# Patient Record
Sex: Male | Born: 1950 | Hispanic: Refuse to answer | Smoking: Current every day smoker
Health system: Southern US, Community
[De-identification: ages and names within clinical notes are randomized; demographics above are authoritative.]

## PROBLEM LIST (undated history)

## (undated) DIAGNOSIS — M1990 Unspecified osteoarthritis, unspecified site: Secondary | ICD-10-CM

## (undated) DIAGNOSIS — I739 Peripheral vascular disease, unspecified: Secondary | ICD-10-CM

## (undated) DIAGNOSIS — R635 Abnormal weight gain: Secondary | ICD-10-CM

## (undated) DIAGNOSIS — R609 Edema, unspecified: Secondary | ICD-10-CM

## (undated) DIAGNOSIS — S93409A Sprain of unspecified ligament of unspecified ankle, initial encounter: Secondary | ICD-10-CM

## (undated) DIAGNOSIS — M79604 Pain in right leg: Secondary | ICD-10-CM

## (undated) DIAGNOSIS — M791 Myalgia, unspecified site: Secondary | ICD-10-CM

## (undated) DIAGNOSIS — J45909 Unspecified asthma, uncomplicated: Secondary | ICD-10-CM

## (undated) DIAGNOSIS — J449 Chronic obstructive pulmonary disease, unspecified: Secondary | ICD-10-CM

## (undated) DIAGNOSIS — I10 Essential (primary) hypertension: Secondary | ICD-10-CM

## (undated) DIAGNOSIS — E785 Hyperlipidemia, unspecified: Secondary | ICD-10-CM

## (undated) DIAGNOSIS — R200 Anesthesia of skin: Secondary | ICD-10-CM

## (undated) DIAGNOSIS — K8020 Calculus of gallbladder without cholecystitis without obstruction: Secondary | ICD-10-CM

## (undated) DIAGNOSIS — K219 Gastro-esophageal reflux disease without esophagitis: Secondary | ICD-10-CM

## (undated) HISTORY — DX: Chronic obstructive pulmonary disease, unspecified: J44.9

## (undated) HISTORY — DX: Pain in right leg: M79.604

## (undated) HISTORY — DX: Anesthesia of skin: R20.0

## (undated) HISTORY — DX: Unspecified asthma, uncomplicated: J45.909

## (undated) HISTORY — DX: Unspecified osteoarthritis, unspecified site: M19.90

## (undated) HISTORY — DX: Myalgia, unspecified site: M79.10

## (undated) HISTORY — DX: Sprain of unspecified ligament of unspecified ankle, initial encounter: S93.409A

## (undated) HISTORY — PX: POLYPECTOMY: SHX149

## (undated) HISTORY — DX: Hyperlipidemia, unspecified: E78.5

## (undated) HISTORY — DX: Edema, unspecified: R60.9

## (undated) HISTORY — DX: Abnormal weight gain: R63.5

## (undated) HISTORY — DX: Gastro-esophageal reflux disease without esophagitis: K21.9

## (undated) HISTORY — DX: Peripheral vascular disease, unspecified: I73.9

---

## 2008-09-15 ENCOUNTER — Emergency Department (HOSPITAL_COMMUNITY): Admission: EM | Admit: 2008-09-15 | Discharge: 2008-09-15 | Payer: Self-pay | Admitting: Emergency Medicine

## 2009-10-04 ENCOUNTER — Ambulatory Visit: Payer: Self-pay | Admitting: Internal Medicine

## 2009-11-15 ENCOUNTER — Ambulatory Visit: Payer: Self-pay | Admitting: Internal Medicine

## 2010-02-16 ENCOUNTER — Ambulatory Visit: Payer: Self-pay | Admitting: Internal Medicine

## 2010-02-16 LAB — CONVERTED CEMR LAB
BUN: 19 mg/dL (ref 6–23)
CO2: 26 meq/L (ref 19–32)
Creatinine, Ser: 0.88 mg/dL (ref 0.40–1.50)
Glucose, Bld: 85 mg/dL (ref 70–99)
Magnesium: 2.2 mg/dL (ref 1.5–2.5)
TSH: 0.767 microintl units/mL (ref 0.350–4.500)
Total Bilirubin: 0.7 mg/dL (ref 0.3–1.2)

## 2010-10-18 LAB — COMPREHENSIVE METABOLIC PANEL
ALT: 21 U/L (ref 0–53)
AST: 24 U/L (ref 0–37)
CO2: 22 mEq/L (ref 19–32)
Chloride: 108 mEq/L (ref 96–112)
GFR calc Af Amer: >60 mL/min (ref >60)
GFR calc non Af Amer: >60 mL/min (ref >60)
Sodium: 142 mEq/L (ref 135–145)
Total Bilirubin: 0.6 mg/dL (ref 0.3–1.2)

## 2010-10-18 LAB — RAPID URINE DRUG SCREEN, HOSP PERFORMED
Benzodiazepines: NOT DETECTED
Cocaine: NOT DETECTED
Opiates: NOT DETECTED

## 2010-10-18 LAB — CBC
RBC: 5.05 MIL/uL (ref 4.22–5.81)
WBC: 7.5 10*3/uL (ref 4.0–10.5)

## 2010-10-18 LAB — DIFFERENTIAL
Basophils Absolute: 0.1 10*3/uL (ref 0.0–0.1)
Eosinophils Absolute: 0.2 10*3/uL (ref 0.0–0.7)
Eosinophils Relative: 3 % (ref 0–5)
Lymphs Abs: 2.9 10*3/uL (ref 0.7–4.0)
Monocytes Absolute: 0.5 10*3/uL (ref 0.1–1.0)

## 2010-10-18 LAB — URINALYSIS, ROUTINE W REFLEX MICROSCOPIC
Bilirubin Urine: NEGATIVE
Hgb urine dipstick: NEGATIVE
Ketones, ur: NEGATIVE mg/dL
Nitrite: NEGATIVE
pH: 6.5 (ref 5.0–8.0)

## 2010-10-18 LAB — URINE MICROSCOPIC-ADD ON

## 2010-10-18 LAB — LIPASE, BLOOD: Lipase: 27 U/L (ref 11–59)

## 2010-10-18 LAB — ETHANOL: Alcohol, Ethyl (B): 171 mg/dL — ABNORMAL HIGH (ref 0–10)

## 2013-04-20 ENCOUNTER — Emergency Department (HOSPITAL_COMMUNITY)
Admission: EM | Admit: 2013-04-20 | Discharge: 2013-04-20 | Disposition: A | Discharge status: Home / Self Care | Payer: Self-pay | Attending: Emergency Medicine | Admitting: Emergency Medicine

## 2013-04-20 ENCOUNTER — Encounter (HOSPITAL_COMMUNITY): Payer: Self-pay | Admitting: Emergency Medicine

## 2013-04-20 ENCOUNTER — Emergency Department (HOSPITAL_COMMUNITY): Payer: Self-pay

## 2013-04-20 DIAGNOSIS — Z79899 Other long term (current) drug therapy: Secondary | ICD-10-CM | POA: Insufficient documentation

## 2013-04-20 DIAGNOSIS — R110 Nausea: Secondary | ICD-10-CM | POA: Insufficient documentation

## 2013-04-20 DIAGNOSIS — F17200 Nicotine dependence, unspecified, uncomplicated: Secondary | ICD-10-CM | POA: Insufficient documentation

## 2013-04-20 DIAGNOSIS — Z7982 Long term (current) use of aspirin: Secondary | ICD-10-CM | POA: Insufficient documentation

## 2013-04-20 DIAGNOSIS — I10 Essential (primary) hypertension: Secondary | ICD-10-CM | POA: Insufficient documentation

## 2013-04-20 DIAGNOSIS — B349 Viral infection, unspecified: Secondary | ICD-10-CM

## 2013-04-20 DIAGNOSIS — J3489 Other specified disorders of nose and nasal sinuses: Secondary | ICD-10-CM | POA: Insufficient documentation

## 2013-04-20 DIAGNOSIS — B9789 Other viral agents as the cause of diseases classified elsewhere: Secondary | ICD-10-CM | POA: Insufficient documentation

## 2013-04-20 HISTORY — DX: Essential (primary) hypertension: I10

## 2013-04-20 LAB — INFLUENZA PANEL BY PCR (TYPE A & B): Influenza A By PCR: NEGATIVE

## 2013-04-20 NOTE — ED Provider Notes (Addendum)
CSN: [id number]     Arrival date & time 04/20/13  1153 History   First MD Initiated Contact with Patient 04/20/13 1532     Chief Complaint  Patient presents with  . Fever  . Nausea  . Nasal Congestion   (Consider location/radiation/quality/duration/timing/severity/associated sxs/prior Treatment) HPI Comments: Donald Martinez is a 62 y.o. Male who presents for evaluation of fever, malaise, nausea, and achiness. He stopped taking his blood pressure medication 3 days ago because he was ill. He is using over-the-counter medications, without relief. No recent travel outside of the country or known sick contacts. 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 There are no other known modifying factors.   Patient is a 62 y.o. male presenting with fever. The history is provided by the patient.  Fever   Past Medical History  Diagnosis Date  . Hypertension    History reviewed. No pertinent past surgical history. History reviewed. No pertinent family history. History  Substance Use Topics  . Smoking status: Current Every Day Smoker    Types: Cigarettes  . Smokeless tobacco: Not on file  . Alcohol Use: Yes    Review of Systems  Constitutional: Positive for fever.  All other systems reviewed and are negative.    Allergies  Review of patient's allergies indicates no known allergies.  Home Medications   Current Outpatient Rx  Name  Route  Sig  Dispense  Refill  . aspirin 81 MG tablet   Oral   Take 81 mg by mouth daily as needed for pain.         Marland Kitchen guaifenesin (ROBITUSSIN) 100 MG/5ML syrup   Oral   Take 200 mg by mouth 3 (three) times daily as needed for cough (cough).         Marland Kitchen PRESCRIPTION MEDICATION      Pt states he takes a "blood pressure medication" not sure where he fills it at. He has taken 1 pill in the last 4 months          BP 161/91  Pulse 65  Temp(Src)  98.2 F (36.8 C) (Oral)  Resp 18  Ht 5\' 6"  (1.676 m)  Wt 180 lb (81.647 kg)  BMI 29.07 kg/m2  SpO2 98% Physical Exam  Nursing note and vitals reviewed. Constitutional: He is oriented to person, place, and time. He appears well-developed and well-nourished.  HENT:  Head: Normocephalic and atraumatic.  Right Ear: External ear normal.  Left Ear: External ear normal.  Eyes: Conjunctivae and EOM are normal. Pupils are equal, round, and reactive to light.  Neck: Normal range of motion and phonation normal. Neck supple.  Cardiovascular: Normal rate, regular rhythm, normal heart sounds and intact distal pulses.   Initial blood pressure, elevated  Pulmonary/Chest: Effort normal and breath sounds normal. He exhibits no bony tenderness.  Abdominal: Soft. Normal appearance. There is no tenderness.  Musculoskeletal: Normal range of motion.  Neurological: He is alert and oriented to person, place, and time. No cranial nerve deficit or sensory deficit. He exhibits normal muscle tone. Coordination normal.  Skin: Skin is warm, dry and intact.  Psychiatric: He has a normal mood and affect. His behavior is normal. Judgment and thought content normal.    ED Course  Procedures (including critical care time)  Patient Vitals for the past 24 hrs:  BP Temp Temp src Pulse Resp SpO2 Height Weight  04/20/13 1900  161/91 mmHg - - 65 - 98 % - -  04/20/13 1844 159/98 mmHg - - 80 18 99 % - -  04/20/13 1707 196/111 mmHg 98.2 F (36.8 C) Oral 67 18 97 % - -  04/20/13 1203 196/100 mmHg 98.2 F (36.8 C) Oral 96 18 96 % 5\' 6"  (1.676 m) 180 lb (81.647 kg)   Labs Review Labs Reviewed  INFLUENZA PANEL BY PCR   Imaging Review Dg Chest 2 View  04/20/2013   CLINICAL DATA:  Fever, nausea and nasal congestion for 3 days.  EXAM: CHEST  2 VIEW  COMPARISON:  PA and lateral chest 09/15/2008.  FINDINGS: Heart size and mediastinal contours are within normal limits. Both lungs are clear. Visualized skeletal structures are  unremarkable.  IMPRESSION: No active cardiopulmonary disease.   Electronically Signed   By: Drusilla Kanner M.D.   On: 04/20/2013 16:21    8:20 PM Reevaluation with update and discussion. After initial assessment and treatment, an updated evaluation reveals He is comfortable.. Ellysia Char L     EKG Interpretation   None       MDM   1. Viral illness   2. Hypertension    Nonspecific symptoms with negative evaluation for serious illness. He can be treated symptomatically at home. He is stable for discharge  Nursing Notes Reviewed/ Care Coordinated, and agree without changes. Applicable Imaging Reviewed.  Interpretation of Laboratory Data incorporated into ED treatment   Plan: Home Medications- OTC medication for cold, restart blood pressure medicine as soon as possible.; Home Treatments and Observation- rest, fluids; return here if the recommended treatment, does not improve the symptoms; Recommended follow up- PCP, when necessary      Flint Melter, MD 04/20/13 2027  Flint Melter, MD 04/20/13 2035

## 2013-04-20 NOTE — ED Notes (Addendum)
Pt reports productive cough with yellow and green colored sputum, nasal congestion and draining also with yellow and green colored mucus, fever, and all over body aches x4 days. Pt reports he has been taking Robitussin, Aleve, and ASA with no relief. Pt states he takes medication for his HTN but has not taken it since Saturday due to taking all of the OTC medication. Pt states "I thought it would interfere with my BP medication."

## 2013-04-20 NOTE — ED Notes (Signed)
States he has not taken his BP medication since Saturday.

## 2013-04-20 NOTE — ED Notes (Signed)
Pt reports he does a lot of volunteer work in [employer] and is concerned he may have been exposed to some kind of virus

## 2013-04-20 NOTE — ED Notes (Signed)
Pt reports that since Sunday he has felt feverish and sluggish. States that he has also been nauseated but no vomiting. Reports generalized achy feeling. Denies any other symptoms at this time.

## 2014-10-07 ENCOUNTER — Encounter (HOSPITAL_COMMUNITY): Payer: Self-pay

## 2014-10-07 ENCOUNTER — Emergency Department (HOSPITAL_COMMUNITY)
Admission: EM | Admit: 2014-10-07 | Discharge: 2014-10-07 | Disposition: A | Discharge status: Home / Self Care | Payer: Self-pay | Attending: Emergency Medicine | Admitting: Emergency Medicine

## 2014-10-07 DIAGNOSIS — I10 Essential (primary) hypertension: Secondary | ICD-10-CM | POA: Insufficient documentation

## 2014-10-07 DIAGNOSIS — M79604 Pain in right leg: Secondary | ICD-10-CM | POA: Insufficient documentation

## 2014-10-07 DIAGNOSIS — Z720 Tobacco use: Secondary | ICD-10-CM | POA: Insufficient documentation

## 2014-10-07 DIAGNOSIS — M79605 Pain in left leg: Secondary | ICD-10-CM | POA: Insufficient documentation

## 2014-10-07 DIAGNOSIS — M79606 Pain in leg, unspecified: Secondary | ICD-10-CM

## 2014-10-07 MED ORDER — OXYCODONE-ACETAMINOPHEN 5-325 MG PO TABS
1.0000 | ORAL_TABLET | Freq: Once | ORAL | Status: AC
  Administered 2014-10-07: 1 via ORAL
  Filled 2014-10-07: qty 1

## 2014-10-07 MED ORDER — OXYCODONE-ACETAMINOPHEN 5-325 MG PO TABS: 1.0000 | ORAL_TABLET | Freq: Four times a day (QID) | ORAL | Status: DC | PRN

## 2014-10-07 NOTE — ED Notes (Signed)
Pt presents via EMS with c/o bilateral leg pain for 10 months. Pt has never seen a doctor for this complaint. Pt was laying in bed when EMS arrived, said he could hardly walk.

## 2014-10-07 NOTE — ED Notes (Signed)
Pt complains of bilateral leg pain for several months, he says it starts in his feet and travels to his knees, he says that it intermittent but seems to be worse

## 2014-10-07 NOTE — ED Provider Notes (Signed)
CSN: [id number]     Arrival date & time 10/07/14  0112 History   First MD Initiated Contact with Patient 10/07/14 [phone]     Chief Complaint  Patient presents with  . Leg Pain     (Consider location/radiation/quality/duration/timing/severity/associated sxs/prior Treatment) Patient is a 64 y.o. male presenting with leg pain. The history is provided by the patient.  Leg Pain Location:  Leg Time since incident:  6 months Injury: no   Leg location:  L leg and R leg Pain details:    Quality:  Aching   Radiates to:  Does not radiate   Severity:  Moderate   Onset quality:  Gradual   Duration:  6 months   Timing:  Constant   Progression:  Worsening Chronicity:  Chronic Dislocation: no   Foreign body present:  No foreign bodies Prior injury to area:  No Relieved by:  Nothing Worsened by:  Activity Ineffective treatments:  None tried Associated symptoms: no fever and no neck pain     Past Medical History  Diagnosis Date  . Hypertension    History reviewed. No pertinent past surgical history. History reviewed. No pertinent family history. History  Substance Use Topics  . Smoking status: Current Every Day Smoker    Types: Cigarettes  . Smokeless tobacco: Not on file  . Alcohol Use: Yes    Review of Systems  Constitutional: Negative for fever.  HENT: Negative for drooling and rhinorrhea.   Eyes: Negative for pain.  Respiratory: Negative for cough and shortness of breath.   Cardiovascular: Negative for chest pain and leg swelling.  Gastrointestinal: Negative for nausea, vomiting, abdominal pain and diarrhea.  Genitourinary: Negative for dysuria and hematuria.  Musculoskeletal: Negative for gait problem and neck pain.  Skin: Negative for color change.  Neurological: Negative for numbness and headaches.  Hematological: Negative for adenopathy.  Psychiatric/Behavioral: Negative for behavioral problems.  All other systems reviewed and are negative.     Allergies  Review  of patient's allergies indicates no known allergies.  Home Medications   Prior to Admission medications   Medication Sig Start Date End Date Taking? Authorizing Provider  acetaminophen (TYLENOL) 500 MG tablet Take 1,000 mg by mouth every 6 (six) hours as needed for mild pain.   Yes Historical Provider, MD  ibuprofen (ADVIL,MOTRIN) 200 MG tablet Take 400 mg by mouth every 6 (six) hours as needed for moderate pain.   Yes Historical Provider, MD   BP 101/52 mmHg  Pulse 82  Temp(Src) 98.1 F (36.7 C) (Oral)  Resp 8  Ht 5\' 6"  (1.676 m)  Wt 185 lb (83.915 kg)  BMI 29.87 kg/m2  SpO2 94% Physical Exam  Constitutional: He is oriented to person, place, and time. He appears well-developed and well-nourished.  HENT:  Head: Normocephalic and atraumatic.  Right Ear: External ear normal.  Left Ear: External ear normal.  Nose: Nose normal.  Mouth/Throat: Oropharynx is clear and moist. No oropharyngeal exudate.  Eyes: Conjunctivae and EOM are normal. Pupils are equal, round, and reactive to light.  Neck: Normal range of motion. Neck supple.  Cardiovascular: Normal rate, regular rhythm, normal heart sounds and intact distal pulses.  Exam reveals no gallop and no friction rub.   No murmur heard. Pulmonary/Chest: Effort normal and breath sounds normal. No respiratory distress. He has no wheezes.  Abdominal: Soft. Bowel sounds are normal. He exhibits no distension. There is no tenderness. There is no rebound and no guarding.  Musculoskeletal: Normal range of motion. He exhibits no  edema or tenderness.  2+ distal pulses in bilateral lower extremities. Normal capillary refill in the lower extremity. Lower extremities appear symmetrical without focal tenderness and are well-perfused and warm.  Normal range of motion of the hips, knees, and ankles bilaterally.  Normal strength and sensation in the lower extremities.  Neurological: He is alert and oriented to person, place, and time.  Skin: Skin is warm  and dry.  Psychiatric: He has a normal mood and affect. His behavior is normal.  Nursing note and vitals reviewed.   ED Course  Procedures (including critical care time) Labs Review Labs Reviewed - No data to display  Imaging Review No results found.   EKG Interpretation None      MDM   Final diagnoses:  Pain of lower extremity, unspecified laterality    4:30 AM 64 y.o. male who presents with diffuse leg pain for 6 months. He states that it has worsened in the last 2 weeks. He denies any fevers or other associated symptoms. Does seem to be slightly worse with ambulation. While he complains of pain in all his joints there is no focal joint that is tender on exam. He has normal range of motion of his lower extremities without significant pain. He has 2+ distal pulses and normal sensation. No evidence of poor perfusion on my exam. He has normal capillary refill and warm lower extremities. Will provide a short prescription of pain medicine. Will recommend follow-up with a PCP.    Pamella Pert, MD 10/08/14 1054

## 2014-11-03 ENCOUNTER — Ambulatory Visit: Payer: Self-pay | Admitting: Internal Medicine

## 2015-05-17 DIAGNOSIS — I10 Essential (primary) hypertension: Secondary | ICD-10-CM

## 2015-05-17 DIAGNOSIS — Z23 Encounter for immunization: Secondary | ICD-10-CM

## 2015-05-21 NOTE — Congregational Nurse Program (Signed)
Client was  followed by  Nurse while  Living at [facility]  was discharged  from [facility] 03-29-15 but  requested the flu vaccine. Nurse called  client on Wednesday to set up appointment for 05-22-15  for his vaccine. Client states he has been okay  but  needs his  blood pressure checked also.

## 2015-05-24 DIAGNOSIS — I10 Essential (primary) hypertension: Secondary | ICD-10-CM

## 2015-05-24 DIAGNOSIS — Z23 Encounter for immunization: Secondary | ICD-10-CM

## 2015-05-24 NOTE — Congregational Nurse Program (Signed)
Client in for his flu shot  As nurse called  And offered it to him. Upon his discharge nurse had informed client he could come back and get his flu shot. Client is doing well in  his apartment. Cooking fresh vegetables and eating well. 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 Client looking good , counseled regarding his elevated blood pressure ,to go home relax ,take meds and will monitor blood pressure. Return to PCP as needed. Client has history of his blood  Pressure up and down. Was seen by  PCP several months ago but  No change in B/p. Client aware of signs and symptoms of distress .

## 2017-09-11 ENCOUNTER — Encounter: Payer: Self-pay | Admitting: Primary Care

## 2018-01-08 ENCOUNTER — Emergency Department (HOSPITAL_COMMUNITY)
Admission: EM | Admit: 2018-01-08 | Discharge: 2018-01-09 | Disposition: A | Discharge status: Home / Self Care | Payer: Medicare Other | Attending: Emergency Medicine | Admitting: Emergency Medicine

## 2018-01-08 ENCOUNTER — Other Ambulatory Visit: Payer: Self-pay

## 2018-01-08 ENCOUNTER — Emergency Department (HOSPITAL_COMMUNITY): Payer: Medicare Other

## 2018-01-08 DIAGNOSIS — F17210 Nicotine dependence, cigarettes, uncomplicated: Secondary | ICD-10-CM | POA: Insufficient documentation

## 2018-01-08 DIAGNOSIS — N179 Acute kidney failure, unspecified: Secondary | ICD-10-CM | POA: Diagnosis not present

## 2018-01-08 DIAGNOSIS — R079 Chest pain, unspecified: Secondary | ICD-10-CM | POA: Diagnosis present

## 2018-01-08 DIAGNOSIS — E860 Dehydration: Secondary | ICD-10-CM

## 2018-01-08 DIAGNOSIS — Z79899 Other long term (current) drug therapy: Secondary | ICD-10-CM | POA: Insufficient documentation

## 2018-01-08 DIAGNOSIS — I10 Essential (primary) hypertension: Secondary | ICD-10-CM | POA: Insufficient documentation

## 2018-01-08 DIAGNOSIS — R0789 Other chest pain: Secondary | ICD-10-CM | POA: Insufficient documentation

## 2018-01-08 DIAGNOSIS — F1010 Alcohol abuse, uncomplicated: Secondary | ICD-10-CM | POA: Diagnosis not present

## 2018-01-08 LAB — COMPREHENSIVE METABOLIC PANEL
ALBUMIN: 3.8 g/dL (ref 3.5–5.0)
ALT: 24 U/L (ref 0–44)
AST: 32 U/L (ref 15–41)
Alkaline Phosphatase: 69 U/L (ref 38–126)
Anion gap: 13 (ref 5–15)
BILIRUBIN TOTAL: 0.9 mg/dL (ref 0.3–1.2)
BUN: 18 mg/dL (ref 8–23)
CHLORIDE: 108 mmol/L (ref 98–111)
CO2: 19 mmol/L — ABNORMAL LOW (ref 22–32)
CREATININE: 1.88 mg/dL — AB (ref 0.61–1.24)
Calcium: 8.4 mg/dL — ABNORMAL LOW (ref 8.9–10.3)
GFR calc Af Amer: 42 mL/min — ABNORMAL LOW (ref >60)
GFR, EST NON AFRICAN AMERICAN: 36 mL/min — AB (ref >60)
GLUCOSE: 130 mg/dL — AB (ref 70–99)
POTASSIUM: 4.2 mmol/L (ref 3.5–5.1)
Sodium: 140 mmol/L (ref 135–145)
TOTAL PROTEIN: 6.9 g/dL (ref 6.5–8.1)

## 2018-01-08 LAB — CBC WITH DIFFERENTIAL/PLATELET
ABS IMMATURE GRANULOCYTES: 0 10*3/uL (ref 0.0–0.1)
BASOS ABS: 0.1 10*3/uL (ref 0.0–0.1)
Basophils Relative: 1 %
EOS PCT: 2 %
Eosinophils Absolute: 0.1 10*3/uL (ref 0.0–0.7)
HCT: 44.5 % (ref 39.0–52.0)
HEMOGLOBIN: 15.3 g/dL (ref 13.0–17.0)
Immature Granulocytes: 1 %
LYMPHS PCT: 28 %
Lymphs Abs: 1.5 10*3/uL (ref 0.7–4.0)
MCH: 30.7 pg (ref 26.0–34.0)
MCHC: 34.4 g/dL (ref 30.0–36.0)
MCV: 89.4 fL (ref 78.0–100.0)
MONO ABS: 0.3 10*3/uL (ref 0.1–1.0)
MONOS PCT: 6 %
NEUTROS ABS: 3.3 10*3/uL (ref 1.7–7.7)
Neutrophils Relative %: 62 %
Platelets: 223 10*3/uL (ref 150–400)
RBC: 4.98 MIL/uL (ref 4.22–5.81)
RDW: 11.9 % (ref 11.5–15.5)
WBC: 5.3 10*3/uL (ref 4.0–10.5)

## 2018-01-08 LAB — TROPONIN I

## 2018-01-08 LAB — ETHANOL: ALCOHOL ETHYL (B): 291 mg/dL — AB (ref <10)

## 2018-01-08 LAB — CBG MONITORING, ED: Glucose-Capillary: 131 mg/dL — ABNORMAL HIGH (ref 70–99)

## 2018-01-08 LAB — LIPASE, BLOOD: Lipase: 42 U/L (ref 11–51)

## 2018-01-08 MED ORDER — SODIUM CHLORIDE 0.9 % IV BOLUS
1000.0000 mL | Freq: Once | INTRAVENOUS | Status: AC
  Administered 2018-01-08: 1000 mL via INTRAVENOUS

## 2018-01-08 MED ORDER — LACTATED RINGERS IV BOLUS
1000.0000 mL | Freq: Once | INTRAVENOUS | Status: AC
  Administered 2018-01-08: 1000 mL via INTRAVENOUS

## 2018-01-08 NOTE — ED Provider Notes (Signed)
[facility] EMERGENCY DEPARTMENT Provider Note   CSN: [id number] Arrival date & time: 01/08/18  1936  History   Chief Complaint Chief Complaint  Patient presents with  . Hypotension  . Chest Pain    HPI Donald Martinez is a 67 y.o. male.  HPI   Patient is a 67 year old male with history of hypertension presenting to the ED for multiple complaints and intoxication. Per patient, he was outside in the heat walking to a holiday festival after drinking "a shot of tequila and beer." He also endorses drinking heavily last night. He states he had worsening dyspnea, chest pain, and abdominal cramping today, although it is difficult to elicit the chronicity of the symptoms as he states they have been going on daily for months. He does state that he had a brief syncopal episode today as well when he was sitting down and fell asleep. He currently feels better. Per RN, he flagged down a patrolling the EMS crew found him to be hypotensive to 80/50 initially which normalized after 500 mL IV NS bolus. No vomiting or diarrhea. Chronic cough. No recent injuries.  Past Medical History:  Diagnosis Date  . Hypertension     There are no active problems to display for this patient.   No past surgical history on file.      Home Medications    Prior to Admission medications   Medication Sig Start Date End Date Taking? Authorizing Provider  acetaminophen (TYLENOL) 500 MG tablet Take 1,000 mg by mouth every 6 (six) hours as needed for mild pain.   Yes [provider]  ATENOLOL PO Take 1 tablet by mouth daily.   Yes [provider]  fluticasone (FLONASE) 50 MCG/ACT nasal spray Place 1 spray into both nostrils daily as needed for allergies or rhinitis.   Yes [provider]  ibuprofen (ADVIL,MOTRIN) 200 MG tablet Take 600 mg by mouth every morning.    Yes [provider]  lisinopril (PRINIVIL,ZESTRIL) 5 MG tablet Take 5 mg by mouth daily.   Yes  [provider]  oxyCODONE-acetaminophen (PERCOCET) 5-325 MG per tablet Take 1 tablet by mouth every 6 (six) hours as needed for moderate pain. 10/07/14  Yes Pamella Pert, MD  vitamin B-12 (CYANOCOBALAMIN) 1000 MCG tablet Take 1,000 mcg by mouth daily.   Yes [provider]    Family History No family history on file.  Social History Social History   Tobacco Use  . Smoking status: Current Every Day Smoker    Types: Cigarettes  Substance Use Topics  . Alcohol use: Yes  . Drug use: No     Allergies   Patient has no known allergies.   Review of Systems Review of Systems  Constitutional: Negative for fever.  HENT: Negative for congestion.   Eyes: Negative for visual disturbance.  Respiratory: Positive for cough and shortness of breath.   Cardiovascular: Positive for chest pain.  Gastrointestinal: Positive for abdominal pain. Negative for diarrhea and vomiting.  Genitourinary: Negative for dysuria.  Musculoskeletal: Negative for back pain.  Skin: Negative for rash.  Neurological: Negative for headaches.  All other systems reviewed and are negative.    Physical Exam Updated Vital Signs BP (!) 110/48   Pulse 74   Temp 97.9 F (36.6 C) (Oral)   Resp 17   Ht 5\' 6"  (1.676 m)   Wt 77.1 kg (170 lb)   SpO2 95%   BMI 27.44 kg/m   Physical Exam  Constitutional: He is oriented  to person, place, and time. No distress.  Speech is slurred and patient making jokes  HENT:  Head: Normocephalic and atraumatic.  Mouth/Throat: Oropharynx is clear and moist.  Eyes: Pupils are equal, round, and reactive to light.  Neck: Neck supple. No JVD present.  Cardiovascular: Normal rate, regular rhythm, normal heart sounds and intact distal pulses.  No murmur heard. Pulmonary/Chest: Breath sounds normal. No respiratory distress. He has no wheezes. He has no rales.  Forced upper airway wheezing, no wheezing on chest auscultation  Abdominal: Soft. He exhibits no  distension and no mass. There is no tenderness. There is no guarding.  Musculoskeletal: Normal range of motion. He exhibits no edema.  No calf tenderness.  Neurological: He is alert and oriented to person, place, and time.  Skin: Skin is warm and dry.  Nursing note and vitals reviewed.    ED Treatments / Results  Labs (all labs ordered are listed, but only abnormal results are displayed) Labs Reviewed  COMPREHENSIVE METABOLIC PANEL - Abnormal; Notable for the following components:      Result Value   CO2 19 (*)    Glucose, Bld 130 (*)    Creatinine, Ser 1.88 (*)    Calcium 8.4 (*)    GFR calc non Af Amer 36 (*)    GFR calc Af Amer 42 (*)    All other components within normal limits  ETHANOL - Abnormal; Notable for the following components:   Alcohol, Ethyl (B) 291 (*)    All other components within normal limits  CBG MONITORING, ED - Abnormal; Notable for the following components:   Glucose-Capillary 131 (*)    All other components within normal limits  CBC WITH DIFFERENTIAL/PLATELET  TROPONIN I  LIPASE, BLOOD    EKG EKG Interpretation  Date/Time:  Thursday January 08 2018 19:39:44 EDT Ventricular Rate:  87 PR Interval:    QRS Duration: 90 QT Interval:  373 QTC Calculation: 449 R Axis:   43 Text Interpretation:  Sinus rhythm Borderline T wave abnormalities No significant change since last tracing Confirmed by Theotis Burrow [phone]) on 01/08/2018 7:46:53 PM   Radiology Dg Chest 2 View  Result Date: 01/08/2018 CLINICAL DATA:  Central chest pain and shortness of breath 4 days. EXAM: CHEST - 2 VIEW COMPARISON:  04/20/2013 FINDINGS: Lungs are adequately inflated and otherwise clear. Cardiomediastinal silhouette is within normal. Mild degenerate change of the spine. Minimal anterior wedging of 2 adjacent lower thoracic vertebral bodies likely chronic. IMPRESSION: No active cardiopulmonary disease. Electronically Signed   By: Marin Olp M.D.   On: 01/08/2018 20:42     Procedures Procedures (including critical care time)  Medications Ordered in ED Medications  sodium chloride 0.9 % bolus 1,000 mL (0 mLs Intravenous Stopped 01/08/18 2115)  lactated ringers bolus 1,000 mL (0 mLs Intravenous Stopped 01/08/18 2336)     Initial Impression / Assessment and Plan / ED Course  I have reviewed the triage vital signs and the nursing notes.  Pertinent labs & imaging results that were available during my care of the patient were reviewed by me and considered in my medical decision making (see chart for details).  Patient with multiple somatic complaints in the setting of alcohol intoxication.  On further elucidation, chest symptoms are chronic and musculoskeletal in description. Worse with certain positions. EKG shows no signs of acute ischemia and is unchanged from prior. CXR is clear. No active symptoms. Initial troponin is negative and do not feel serial biomarkers is indicated with the  chronicity of his symptoms. Also considered PE. He does report vague history of blood clot in the leg when asked directly. However, strongly doubt PE without characteristic symptoms, tachycardia, hypoxia, or signs of DVT at this time. Do not feel risks of CTA are warranted given his AKI as below, and do not feel admission for VQ scan is warranted.  His abdomen is benign. Screening labs are otherwise significant for elevated ethanol at 291 and AKI with creatinine 1.88, suspect dehydration. Patient hydrated with 2 L IV crystalloid in addition to 500 mL given PTA. As patient is tolerating by mouth intake, do not feel admission warranted at this time. Long discussion held with patient regarding his AKI and need for hydration at home to prevent further permanent renal dysfunction, potentially leading to dialysis. Patient verbalized understanding. He did appear mildly intoxicated at the end of the encounter, however he has his sobered friend Jeneen Rinks) at bedside who states he will take him home,  and that he has a roommate at home.   While in the ED, I was called to bedside by his friend for patient going to sleep in the middle their conversation. Patient was sleeping when I walked in the room and quickly woke to sternal rub. Suspect fatigue and intoxication. No arrhythmia on telemetry. He was walking briskly in the hallway with his cane per baseline. He remained pleasant, cooperative, and eager for discharge to eat. Will discharge home with PCP follow up.  Patient and friend informed of all ED findings. Return precautions and follow up plan reviewed. All questions answered.   Final Clinical Impressions(s) / ED Diagnoses   Final diagnoses:  AKI (acute kidney injury) ([place])  Dehydration  Atypical chest pain  Alcohol abuse     Prescilla Sours, MD 01/09/18 7342    Rex Kras Wenda Overland, MD 01/11/18 1724

## 2018-01-08 NOTE — ED Notes (Signed)
Pt ambulated independently with cane stated he felt "comfortable"

## 2018-01-08 NOTE — ED Notes (Signed)
Patient transported to X-ray 

## 2018-01-08 NOTE — ED Triage Notes (Signed)
[place] [facility] [place] flagged down patrolling EMS. Reports centralized chest pain non-radiating and SOB; says he passed out, no syncope reported by [facility] Hypotensive, initial 80/50.Donald Martinez..received 500cc bolus up to 111/63; NSR HR 82, 95% on RA; 20 gage LHand  Reports 1 beer but appears intoxicated

## 2018-06-22 ENCOUNTER — Encounter: Payer: Self-pay | Admitting: Internal Medicine

## 2018-07-21 ENCOUNTER — Other Ambulatory Visit (INDEPENDENT_AMBULATORY_CARE_PROVIDER_SITE_OTHER): Payer: Medicare HMO

## 2018-07-21 ENCOUNTER — Encounter: Payer: Self-pay | Admitting: Internal Medicine

## 2018-07-21 ENCOUNTER — Ambulatory Visit (INDEPENDENT_AMBULATORY_CARE_PROVIDER_SITE_OTHER): Payer: Medicare HMO | Admitting: Internal Medicine

## 2018-07-21 VITALS — BP 100/80 | HR 68 | Ht 66.0 in | Wt 191.6 lb

## 2018-07-21 DIAGNOSIS — Z1211 Encounter for screening for malignant neoplasm of colon: Secondary | ICD-10-CM

## 2018-07-21 DIAGNOSIS — R109 Unspecified abdominal pain: Secondary | ICD-10-CM

## 2018-07-21 DIAGNOSIS — K219 Gastro-esophageal reflux disease without esophagitis: Secondary | ICD-10-CM | POA: Diagnosis not present

## 2018-07-21 DIAGNOSIS — Z720 Tobacco use: Secondary | ICD-10-CM

## 2018-07-21 DIAGNOSIS — F1010 Alcohol abuse, uncomplicated: Secondary | ICD-10-CM

## 2018-07-21 DIAGNOSIS — R194 Change in bowel habit: Secondary | ICD-10-CM

## 2018-07-21 DIAGNOSIS — K5901 Slow transit constipation: Secondary | ICD-10-CM

## 2018-07-21 LAB — CBC WITH DIFFERENTIAL/PLATELET
BASOS PCT: 1.1 % (ref 0.0–3.0)
Basophils Absolute: 0.1 10*3/uL (ref 0.0–0.1)
EOS ABS: 0.2 10*3/uL (ref 0.0–0.7)
Eosinophils Relative: 4.2 % (ref 0.0–5.0)
HCT: 44.5 % (ref 39.0–52.0)
Hemoglobin: 15.7 g/dL (ref 13.0–17.0)
LYMPHS ABS: 1.1 10*3/uL (ref 0.7–4.0)
Lymphocytes Relative: 23.2 % (ref 12.0–46.0)
MCHC: 35.3 g/dL (ref 30.0–36.0)
MCV: 89.9 fl (ref 78.0–100.0)
MONO ABS: 0.5 10*3/uL (ref 0.1–1.0)
MONOS PCT: 9.3 % (ref 3.0–12.0)
NEUTROS ABS: 3 10*3/uL (ref 1.4–7.7)
Neutrophils Relative %: 62.2 % (ref 43.0–77.0)
PLATELETS: 233 10*3/uL (ref 150.0–400.0)
RBC: 4.95 Mil/uL (ref 4.22–5.81)
RDW: 13.2 % (ref 11.5–15.5)
WBC: 4.9 10*3/uL (ref 4.0–10.5)

## 2018-07-21 LAB — COMPREHENSIVE METABOLIC PANEL
ALT: 19 U/L (ref 0–53)
AST: 18 U/L (ref 0–37)
Albumin: 3.9 g/dL (ref 3.5–5.2)
Alkaline Phosphatase: 102 U/L (ref 39–117)
BUN: 16 mg/dL (ref 6–23)
CHLORIDE: 104 meq/L (ref 96–112)
CO2: 26 meq/L (ref 19–32)
CREATININE: 1.02 mg/dL (ref 0.40–1.50)
Calcium: 9.2 mg/dL (ref 8.4–10.5)
GFR: 77.77 mL/min (ref >60.00)
GLUCOSE: 107 mg/dL — AB (ref 70–99)
Potassium: 3.9 mEq/L (ref 3.5–5.1)
Sodium: 138 mEq/L (ref 135–145)
Total Bilirubin: 0.4 mg/dL (ref 0.2–1.2)
Total Protein: 6.9 g/dL (ref 6.0–8.3)

## 2018-07-21 LAB — LIPASE: LIPASE: 49 U/L (ref 11.0–59.0)

## 2018-07-21 MED ORDER — NA SULFATE-K SULFATE-MG SULF 17.5-3.13-1.6 GM/177ML PO SOLN: 1.0000 | Freq: Once | ORAL | 0 refills | Status: AC

## 2018-07-21 NOTE — Patient Instructions (Signed)
Your provider has requested that you go to the basement level for lab work before leaving today. Press "B" on the elevator. The lab is located at the first door on the left as you exit the elevator.  You have been scheduled for an abdominal ultrasound at [facility] (1st floor of hospital) on 07/24/2018 at 8:30am. Please arrive 15 minutes prior to your appointment for registration. Make certain not to have anything to eat or drink after midnight prior to your appointment. Should you need to reschedule your appointment, please contact radiology at [phone]. This test typically takes about 30 minutes to perform.  You have been scheduled for an endoscopy and colonoscopy. Please follow the written instructions given to you at your visit today. Please pick up your prep supplies at the pharmacy within the next 1-3 days. If you use inhalers (even only as needed), please bring them with you on the day of your procedure. Your physician has requested that you go to www.[email or web address].com and enter the access code given to you at your visit today. This web site gives a general overview about your procedure. However, you should still follow specific instructions given to you by our office regarding your preparation for the procedure.

## 2018-07-21 NOTE — Progress Notes (Signed)
HISTORY OF PRESENT ILLNESS:  Donald Martinez is a 68 y.o. male, [place] native, who was sent by Donald Folks, NP regarding multiple GI complaints and the need for endoscopic evaluation.  Patient reports a long hiatus of healthcare due to lack of medical insurance.  He recently established care with Donald Martinez.  The patient has multiple GI chief complaints including chronic GERD, bloating, nausea, and change in bowel habits with constipation.  He tells me that he has a 4-year history of problems with abdominal cramping which occurs several times per month.  Typically last 20 minutes.  He self medicates with Aleve and stretching.  He also has a history of classic GERD for which PPI therapy is helpful.  Currently taking omeprazole 20 mg daily.  He reports weight gain.  Also problems with significant abdominal bloating and worsening constipation for which he takes laxatives on demand.  Review of outside records shows that the patient went to the emergency room January 08, 2018 with alcohol intoxication and dehydration.  Review of x-rays shows his chest x-ray was clear.  Review of laboratories finds unremarkable comprehensive metabolic panel.  Negative troponin.  Normal CBC with hemoglobin 15.3.  The patient is a chronic smoker.  He also reports that he drinks 6 alcoholic beverages daily including beer and liquor.  REVIEW OF SYSTEMS:  All non-GI ROS negative unless otherwise stated in the HPI except for sinus and allergy trouble, arthritis, back pain, cough, shortness of breath, sleeping problems  Past Medical History:  Diagnosis Date  . COPD (chronic obstructive pulmonary disease) ([place])   . Hypertension     No past surgical history on file.  Social History Donald Martinez  reports that he has been smoking cigarettes. He has never used smokeless tobacco. He reports current alcohol use. He reports that he does not use drugs.  family history includes Breast cancer in his sister; Heart disease in his father and  mother; Pancreatic cancer in his brother; Prostate cancer in his brother.  No Known Allergies     PHYSICAL EXAMINATION: Vital signs: BP 100/80   Pulse 68   Ht 5\' 6"  (1.676 m)   Wt 191 lb 9.6 oz (86.9 kg)   BMI 30.93 kg/m   Constitutional: generally well-appearing, no acute distress Psychiatric: alert and oriented x3, cooperative Eyes: extraocular movements intact, anicteric, conjunctiva pink Mouth: oral pharynx moist, no lesions Neck: supple no lymphadenopathy Cardiovascular: heart regular rate and rhythm, no murmur Lungs: clear to auscultation bilaterally except for mild end expiratory wheezing bilaterally Abdomen: soft, obese, nontender, nondistended, no obvious ascites, no peritoneal signs, normal bowel sounds, no organomegaly Rectal: Deferred until colonoscopy Extremities: no clubbing or cyanosis.  Trace lower extremity edema bilaterally Skin: no lesions on visible extremities Neuro: No focal deficits. No asterixis.  Cranial nerves intact  ASSESSMENT:  1.  Chronic intermittent abdominal cramping discomfort.  Etiology unclear.  Progressive abdominal distention.  Rule out liver disease.  Rule out ascites 2.  Chronic GERD.  Requires PPI for relief.  Takes chronic NSAIDs.  Rule out ulcer disease 3.  Progressive constipation with bloating.  Rule out functional.  Rule out obstructive process such as cancer 4.  Chronic alcohol abuse 5.  Chronic tobacco abuse  PLAN:  1.  Laboratories today including CBC, comprehensive metabolic panel, serum lipase.  We will contact the patient directly with these results 2.  Abdominal ultrasound to evaluate abdominal distention.  Evaluate the liver.  Rule out ascites.  We will contact the patient directly with the results  3.  Schedule upper endoscopy to evaluate abdominal discomfort and chronic GERD.The nature of the procedure, as well as the risks, benefits, and alternatives were carefully and thoroughly reviewed with the patient. Ample time for  discussion and questions allowed. The patient understood, was satisfied, and agreed to proceed. 4.  Schedule colonoscopy to provide colon cancer screening, evaluate change in bowel habits with abdominal discomfort and abdominal distention.  The principal concern would be colon cancer.The nature of the procedure, as well as the risks, benefits, and alternatives were carefully and thoroughly reviewed with the patient. Ample time for discussion and questions allowed. The patient understood, was satisfied, and agreed to proceed. 5.  Consider MiraLAX regularly if appropriate after above work-up completed 6.  Stop smoking.  Advised 7.  Decrease alcohol consumption to exceed no more than 3 standard alcoholic beverages per day and a male.  Advised A copy of this consultation note has been sent to Donald Martinez

## 2018-07-23 ENCOUNTER — Telehealth: Payer: Self-pay | Admitting: Internal Medicine

## 2018-07-23 NOTE — Telephone Encounter (Signed)
Pt social worker karen called in and stated that the pt wants to know if he still needs to keep appt for Friday if he was not able to purchase the NA Sulfate K? He stated that it was to expensive

## 2018-07-23 NOTE — Telephone Encounter (Signed)
Pt is scheduled for [place] tomorrow and does not need the suprep for that appt. Let pt know we have a suprep sample he can pick up for the [facility] and he knows to pick it up ahead of time.

## 2018-07-24 ENCOUNTER — Ambulatory Visit (HOSPITAL_COMMUNITY)
Admission: RE | Admit: 2018-07-24 | Discharge: 2018-07-24 | Disposition: A | Discharge status: Home / Self Care | Payer: Medicare HMO | Source: Ambulatory Visit | Attending: Internal Medicine | Admitting: Internal Medicine

## 2018-07-24 DIAGNOSIS — Z1211 Encounter for screening for malignant neoplasm of colon: Secondary | ICD-10-CM | POA: Diagnosis present

## 2018-07-24 DIAGNOSIS — K219 Gastro-esophageal reflux disease without esophagitis: Secondary | ICD-10-CM | POA: Insufficient documentation

## 2018-07-24 DIAGNOSIS — R109 Unspecified abdominal pain: Secondary | ICD-10-CM

## 2018-08-04 ENCOUNTER — Ambulatory Visit (AMBULATORY_SURGERY_CENTER): Payer: Medicare HMO | Admitting: Internal Medicine

## 2018-08-04 ENCOUNTER — Encounter: Payer: Self-pay | Admitting: Internal Medicine

## 2018-08-04 VITALS — BP 115/75 | HR 64 | Temp 99.3°F | Resp 21 | Ht 66.0 in | Wt 191.0 lb

## 2018-08-04 DIAGNOSIS — Z1211 Encounter for screening for malignant neoplasm of colon: Secondary | ICD-10-CM

## 2018-08-04 DIAGNOSIS — D123 Benign neoplasm of transverse colon: Secondary | ICD-10-CM | POA: Diagnosis not present

## 2018-08-04 DIAGNOSIS — D128 Benign neoplasm of rectum: Secondary | ICD-10-CM

## 2018-08-04 DIAGNOSIS — K317 Polyp of stomach and duodenum: Secondary | ICD-10-CM | POA: Diagnosis not present

## 2018-08-04 DIAGNOSIS — K449 Diaphragmatic hernia without obstruction or gangrene: Secondary | ICD-10-CM | POA: Diagnosis not present

## 2018-08-04 DIAGNOSIS — D122 Benign neoplasm of ascending colon: Secondary | ICD-10-CM

## 2018-08-04 DIAGNOSIS — D124 Benign neoplasm of descending colon: Secondary | ICD-10-CM | POA: Diagnosis not present

## 2018-08-04 DIAGNOSIS — K253 Acute gastric ulcer without hemorrhage or perforation: Secondary | ICD-10-CM

## 2018-08-04 DIAGNOSIS — K5140 Inflammatory polyps of colon without complications: Secondary | ICD-10-CM | POA: Diagnosis not present

## 2018-08-04 DIAGNOSIS — K219 Gastro-esophageal reflux disease without esophagitis: Secondary | ICD-10-CM | POA: Diagnosis not present

## 2018-08-04 DIAGNOSIS — R109 Unspecified abdominal pain: Secondary | ICD-10-CM

## 2018-08-04 DIAGNOSIS — D129 Benign neoplasm of anus and anal canal: Secondary | ICD-10-CM

## 2018-08-04 DIAGNOSIS — K635 Polyp of colon: Secondary | ICD-10-CM

## 2018-08-04 HISTORY — PX: COLONOSCOPY: SHX174

## 2018-08-04 MED ORDER — SODIUM CHLORIDE 0.9 % IV SOLN: 500.0000 mL | Freq: Once | INTRAVENOUS | Status: DC

## 2018-08-04 NOTE — Patient Instructions (Signed)
Discharge instructions given. Handout on polyps. Resume previous medications. Biopsies taken. Office will schedule appointment for surgeon. YOU HAD AN ENDOSCOPIC PROCEDURE TODAY AT [facility]:   Refer to the procedure report that was given to you for any specific questions about what was found during the examination.  If the procedure report does not answer your questions, please call your gastroenterologist to clarify.  If you requested that your care partner not be given the details of your procedure findings, then the procedure report has been included in a sealed envelope for you to review at your convenience later.  YOU SHOULD EXPECT: Some feelings of bloating in the abdomen. Passage of more gas than usual.  Walking can help get rid of the air that was put into your GI tract during the procedure and reduce the bloating. If you had a lower endoscopy (such as a colonoscopy or flexible sigmoidoscopy) you may notice spotting of blood in your stool or on the toilet paper. If you underwent a bowel prep for your procedure, you may not have a normal bowel movement for a few days.  Please Note:  You might notice some irritation and congestion in your nose or some drainage.  This is from the oxygen used during your procedure.  There is no need for concern and it should clear up in a day or so.  SYMPTOMS TO REPORT IMMEDIATELY:   Following lower endoscopy (colonoscopy or flexible sigmoidoscopy):  Excessive amounts of blood in the stool  Significant tenderness or worsening of abdominal pains  Swelling of the abdomen that is new, acute  Fever of 100F or higher   Following upper endoscopy (EGD)  Vomiting of blood or coffee ground material  New chest pain or pain under the shoulder blades  Painful or persistently difficult swallowing  New shortness of breath  Fever of 100F or higher  Black, tarry-looking stools  For urgent or emergent issues, a gastroenterologist can be reached  at any hour by calling [phone].   DIET:  We do recommend a small meal at first, but then you may proceed to your regular diet.  Drink plenty of fluids but you should avoid alcoholic beverages for 24 hours.  ACTIVITY:  You should plan to take it easy for the rest of today and you should NOT DRIVE or use heavy machinery until tomorrow (because of the sedation medicines used during the test).    FOLLOW UP: Our staff will call the number listed on your records the next business day following your procedure to check on you and address any questions or concerns that you may have regarding the information given to you following your procedure. If we do not reach you, we will leave a message.  However, if you are feeling well and you are not experiencing any problems, there is no need to return our call.  We will assume that you have returned to your regular daily activities without incident.  If any biopsies were taken you will be contacted by phone or by letter within the next 1-3 weeks.  Please call us at [phone] if you have not heard about the biopsies in 3 weeks.    SIGNATURES/CONFIDENTIALITY: You and/or your care partner have signed paperwork which will be entered into your electronic medical record.  These signatures attest to the fact that that the information above on your After Visit Summary has been reviewed and is understood.  Full responsibility of the confidentiality of this discharge information lies  with you and/or your care-partner.

## 2018-08-04 NOTE — Progress Notes (Signed)
Called to room to assist during endoscopic procedure.  Patient ID and intended procedure confirmed with present staff. Received instructions for my participation in the procedure from the performing physician.  

## 2018-08-04 NOTE — Progress Notes (Signed)
Pt's states no medical or surgical changes since previsit or office visit. 

## 2018-08-04 NOTE — Op Note (Signed)
[place] Patient Name: Donald Martinez Procedure Date: 08/04/2018 1:15 PM MRN: [id number] Endoscopist: Docia Chuck. Henrene Pastor , MD Age: 68 Referring MD:  Date of Birth: [birth year] Gender: Male Account #: [id number] Procedure:                Colonoscopy with cold snare x 8; biopsy; submucosal                            injection tattoo Indications:              Screening for colorectal malignant neoplasm Medicines:                Monitored Anesthesia Care Procedure:                Pre-Anesthesia Assessment:                           - Prior to the procedure, a History and Physical                            was performed, and patient medications and                            allergies were reviewed. The patient's tolerance of                            previous anesthesia was also reviewed. The risks                            and benefits of the procedure and the sedation                            options and risks were discussed with the patient.                            All questions were answered, and informed consent                            was obtained. Prior Anticoagulants: The patient has                            taken no previous anticoagulant or antiplatelet                            agents. ASA Grade Assessment: II - A patient with                            mild systemic disease. After reviewing the risks                            and benefits, the patient was deemed in                            satisfactory condition to undergo the procedure.  After obtaining informed consent, the colonoscope                            was passed under direct vision. Throughout the                            procedure, the patient's blood pressure, pulse, and                            oxygen saturations were monitored continuously. The                            Colonoscope was introduced through the anus and                            advanced to  the the cecum, identified by                            appendiceal orifice and ileocecal valve. The                            ileocecal valve, appendiceal orifice, and rectum                            were photographed. The quality of the bowel                            preparation was excellent. The colonoscopy was                            performed without difficulty. The patient tolerated                            the procedure well. The bowel preparation used was                            SUPREP. Scope In: 1:44:52 PM Scope Out: 2:16:34 PM Scope Withdrawal Time: 0 hours 22 minutes 7 seconds  Total Procedure Duration: 0 hours 31 minutes 42 seconds  Findings:                 Two complex polyp lesions measuring 15 to 20 mm was                            found in transverse colon and region of the hepatic                            flexure. Biopsies were taken with a cold forceps                            for histology. Area was tattooed with an injection                            of Spot (carbon black) just distal to  each lesion.                           Eight polyps were found in the rectum, descending                            colon, splenic flexure, transverse colon and                            ascending colon. The polyps were 4 to 10 mm in                            size. These polyps were removed with a cold snare.                            Resection and retrieval were complete.                           The exam was otherwise without abnormality on                            direct and retroflexion views. Complications:            No immediate complications. Estimated blood loss:                            None. Estimated Blood Loss:     Estimated blood loss: none. Impression:               - Complex polypoid lesions as described. Atypical                            appearance. Biopsied to rule out adenoma. Tattooed.                           - Eight 4 to 10 mm  polyps in the rectum, in the                            descending colon, at the splenic flexure, in the                            transverse colon and in the ascending colon,                            removed with a cold snare. Resected and retrieved.                           - The examination was otherwise normal on direct                            and retroflexion views. Recommendation:           - Repeat colonoscopy date to be determined after  pending pathology results are reviewed for                            surveillance.                           - Patient has a contact number available for                            emergencies. The signs and symptoms of potential                            delayed complications were discussed with the                            patient. Return to normal activities tomorrow.                            Written discharge instructions were provided to the                            patient.                           - Resume previous diet.                           - Continue present medications.                           - Await pathology results. Docia Chuck. Henrene Pastor, MD 08/04/2018 2:46:42 PM This report has been signed electronically.

## 2018-08-04 NOTE — Op Note (Signed)
[place] Patient Name: Donald Martinez Procedure Date: 08/04/2018 1:16 PM MRN: [id number] Endoscopist: Docia Chuck. Henrene Pastor , MD Age: 68 Referring MD:  Date of Birth: July 21, 1950 Gender: Male Account #: [id number] Procedure:                Upper GI endoscopy with biopsies Indications:              Abdominal pain Medicines:                Monitored Anesthesia Care Procedure:                Pre-Anesthesia Assessment:                           - Prior to the procedure, a History and Physical                            was performed, and patient medications and                            allergies were reviewed. The patient's tolerance of                            previous anesthesia was also reviewed. The risks                            and benefits of the procedure and the sedation                            options and risks were discussed with the patient.                            All questions were answered, and informed consent                            was obtained. Prior Anticoagulants: The patient has                            taken no previous anticoagulant or antiplatelet                            agents. ASA Grade Assessment: II - A patient with                            mild systemic disease. After reviewing the risks                            and benefits, the patient was deemed in                            satisfactory condition to undergo the procedure.                           After obtaining informed consent, the endoscope was  passed under direct vision. Throughout the                            procedure, the patient's blood pressure, pulse, and                            oxygen saturations were monitored continuously. The                            Model GIF-[id number]) scope was introduced                            through the mouth, and advanced to the second part                            of duodenum. The upper GI  endoscopy was                            accomplished without difficulty. The patient                            tolerated the procedure well. Scope In: Scope Out: Findings:                 The esophagus revealed an incidental distal                            esophageal ring but was otherwise normal.                           The stomach a small sliding hiatal hernia and mild                            diffuse gastric erythema as well as several                            prepyloric erosions. Biopsies were taken with a                            cold forceps for Helicobacter pylori testing using                            CLOtest.                           The examined duodenum was normal.                           The cardia and gastric fundus were normal on                            retroflexion. Complications:            No immediate complications. Estimated Blood Loss:     Estimated blood loss: none. Impression:               1.  GERD                           2. Incidental esophageal ring                           3. Gastric erythema and erosions status post CLO                            biopsy. Recommendation:           1. Continue current medications                           2. Follow-up CLO biopsy and treat if positive                           3. Please refer to general surgery for                            consideration of laparoscopic cholecystectomy                            "recurrent abdominal pain, gallstones". Docia Chuck. Henrene Pastor, MD 08/04/2018 2:52:19 PM This report has been signed electronically.

## 2018-08-04 NOTE — Progress Notes (Signed)
PT taken to PACU. Monitors in place. VSS. Report given to RN. 

## 2018-08-05 ENCOUNTER — Telehealth: Payer: Self-pay

## 2018-08-05 ENCOUNTER — Telehealth: Payer: Self-pay | Admitting: *Deleted

## 2018-08-05 LAB — HELICOBACTER PYLORI SCREEN-BIOPSY: UREASE: NEGATIVE

## 2018-08-05 NOTE — Telephone Encounter (Signed)
  Follow up Call-  Call back number 08/04/2018  Post procedure Call Back phone  # [phone]  Permission to leave phone message Yes  Some recent data might be hidden     Patient questions:  Do you have a fever, pain , or abdominal swelling? No. Pain Score  0 *  Have you tolerated food without any problems? Yes.    Have you been able to return to your normal activities? Yes.    Do you have any questions about your discharge instructions: Diet   No. Medications  No. Follow up visit  No.  Do you have questions or concerns about your Care? No.  Actions: * If pain score is 4 or above: No action needed, pain <4.

## 2018-08-05 NOTE — Telephone Encounter (Signed)
Referral faxed to [facility] for appt to discuss possible gallbladder surgery.

## 2018-08-07 ENCOUNTER — Telehealth: Payer: Self-pay

## 2018-08-07 NOTE — Telephone Encounter (Signed)
-----   Message from Mila Merry sent at 08/07/2018  3:42 PM EST ----- Regarding: RE: Referral Left vm appt 08/13/18 Dr.Ingram ----- Message ----- From: Algernon Huxley, RN Sent: 08/06/2018  10:11 AM EST To: Mila Merry Subject: Referral                                       Checking on referral for gallbladder surgery  Thanks, Vaughan Basta

## 2018-08-12 ENCOUNTER — Encounter: Payer: Self-pay | Admitting: Internal Medicine

## 2018-08-13 ENCOUNTER — Other Ambulatory Visit: Payer: Self-pay | Admitting: General Surgery

## 2018-10-26 ENCOUNTER — Encounter (HOSPITAL_BASED_OUTPATIENT_CLINIC_OR_DEPARTMENT_OTHER): Payer: Self-pay | Admitting: *Deleted

## 2018-10-26 ENCOUNTER — Encounter (HOSPITAL_BASED_OUTPATIENT_CLINIC_OR_DEPARTMENT_OTHER)
Admission: RE | Admit: 2018-10-26 | Discharge: 2018-10-26 | Disposition: A | Discharge status: Home / Self Care | Payer: Medicare HMO | Source: Ambulatory Visit | Attending: General Surgery | Admitting: General Surgery

## 2018-10-26 ENCOUNTER — Other Ambulatory Visit: Payer: Self-pay

## 2018-10-26 ENCOUNTER — Inpatient Hospital Stay (HOSPITAL_COMMUNITY): Admission: RE | Admit: 2018-10-26 | Payer: Medicare HMO | Source: Ambulatory Visit

## 2018-10-26 DIAGNOSIS — Z01812 Encounter for preprocedural laboratory examination: Secondary | ICD-10-CM | POA: Insufficient documentation

## 2018-10-26 LAB — CBC WITH DIFFERENTIAL/PLATELET
Abs Immature Granulocytes: 0.03 10*3/uL (ref 0.00–0.07)
Basophils Absolute: 0.1 10*3/uL (ref 0.0–0.1)
Basophils Relative: 1 %
Eosinophils Absolute: 0.1 10*3/uL (ref 0.0–0.5)
Eosinophils Relative: 3 %
HCT: 43.4 % (ref 39.0–52.0)
Hemoglobin: 14.8 g/dL (ref 13.0–17.0)
Immature Granulocytes: 1 %
Lymphocytes Relative: 21 %
Lymphs Abs: 1 10*3/uL (ref 0.7–4.0)
MCH: 30.8 pg (ref 26.0–34.0)
MCHC: 34.1 g/dL (ref 30.0–36.0)
MCV: 90.2 fL (ref 80.0–100.0)
Monocytes Absolute: 0.4 10*3/uL (ref 0.1–1.0)
Monocytes Relative: 8 %
Neutro Abs: 3.3 10*3/uL (ref 1.7–7.7)
Neutrophils Relative %: 66 %
Platelets: 213 10*3/uL (ref 150–400)
RBC: 4.81 MIL/uL (ref 4.22–5.81)
RDW: 12.9 % (ref 11.5–15.5)
WBC: 4.9 10*3/uL (ref 4.0–10.5)
nRBC: 0 % (ref 0.0–0.2)

## 2018-10-26 LAB — COMPREHENSIVE METABOLIC PANEL
ALT: 33 U/L (ref 0–44)
AST: 28 U/L (ref 15–41)
Albumin: 3.7 g/dL (ref 3.5–5.0)
Alkaline Phosphatase: 103 U/L (ref 38–126)
Anion gap: 11 (ref 5–15)
BUN: 18 mg/dL (ref 8–23)
CO2: 26 mmol/L (ref 22–32)
Calcium: 9.3 mg/dL (ref 8.9–10.3)
Chloride: 102 mmol/L (ref 98–111)
Creatinine, Ser: 0.99 mg/dL (ref 0.61–1.24)
GFR calc Af Amer: >60 mL/min (ref >60)
GFR calc non Af Amer: >60 mL/min (ref >60)
Glucose, Bld: 130 mg/dL — ABNORMAL HIGH (ref 70–99)
Potassium: 3.9 mmol/L (ref 3.5–5.1)
Sodium: 139 mmol/L (ref 135–145)
Total Bilirubin: 0.9 mg/dL (ref 0.3–1.2)
Total Protein: 6.8 g/dL (ref 6.5–8.1)

## 2018-10-26 NOTE — Progress Notes (Signed)
Ensure pre surgery drink given with instructions to complete by 0430 dos, pt verbalized understanding. 

## 2018-11-01 NOTE — H&P (Signed)
Donald Donald Martinez  Location: [facility] Patient #: [id number] DOB: [birth year] Single / Language: Donald Donald Martinez / Race: Refused to Report/Unreported Male        History of Present Illness      The patient is a 68 year old male who presents for evaluation of gall stones. This is a 68 year old gentleman, referred by Donald Donald Martinez for management of gallstones. His PCP is listed as Donald Donald Martinez, in pain. Donald Donald Martinez looking is also listed.      The patient is several year history of intermittent episodes of upper abdominal cramping and bloating. Can be right or left can. Can go to the back. Not necessarily related to meals. Diarrhea is rare. Occasionally but not often he has nausea and vomiting associated with this. He says is now having 2-3 times a week. Extensive workup by Donald Donald Martinez recently. Upper endoscopy showed mild gastritis. H. pylori testing negative. CBC, complete metabolic panel, lipase normal on July 21, 2018.  Colonoscopy shows numerous polyps some inflammatory and some adenomatous. Follow-up colonoscopy is to be arranged.  Ultrasound shows several gallstones. CBD 8 mm. No inflammatory changes.      Past history significant for daily tobacco and alcohol use. He says he has significantly slowed down and almost quit smoking alcohol for the past 6 weeks. He says his tobacco intake is down to less than a quarter pack per day. BMI 30. COPD. Hypertension. No abdominal surgery Family history reveals he is adopted. He knows that his mother is living and has dementia. He knows nothing of his father Social history reveals that he is single. Lives alone. No children. Alcohol and tobacco history as above. He is retired from [employer]. He says he works for the [employer] at the [employer] term and also works for the [employer] doing casual work. He doesn't want to miss the [facility] tournament. Originally from [place]      I discussed his GI workup. I told  him that it was quite likely that his symptoms were due to gallstones although not 045% certain. Considering the workup and findings to date, I advised him to have elective cholecystectomy. He agrees that he wants to do this but most to wait until April so he can do all of his other work. I told him that was fine but advised him that if symptoms progress he should have it done sooner. We will schedule him  for laparoscopic cholecystectomy with cholangiogram. I discussed the indications, details, techniques, and numerous risk of the surgery with him. He is aware the risks of bleeding, infection, conversion to open laparotomy, bile leak, port site hernia, diarrhea, injury to adjacent organs with major reconstructive surgery. He understands all these issues. All of his questions are answered. He agrees with this plan.     Recently he has been having accelerating symptoms so we will move his surgery up.   Past Surgical History No pertinent past surgical history   Diagnostic Studies History  Colonoscopy  within last year  Allergies  No Known Drug Allergies   Medication History  amLODIPine Besylate (2.5MG  Tablet, Oral) Active. Diclofenac Sodium (1% Gel, Transdermal) Active. Montelukast Sodium (10MG  Tablet, Oral) Active. Omeprazole (20MG  Capsule DR, Oral) Active. Cyanocobalamin-Methylcobalamin (10000 (B12)MCG/2ML Liquid, Sublingual) Active. 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 Caffeine use  Carbonated beverages, Coffee, Tea. No drug use  Tobacco use  Current every day smoker.  Family History  Anesthetic complications  Mother. Hypertension  Brother, Mother, Sister. Malignant Neoplasm Of Pancreas  Brother.  Other Problems  Arthritis  Atrial Fibrillation  Back Pain  Gastroesophageal Reflux Disease  Hemorrhoids  High blood pressure     Review of Systems  General Present-  Fatigue and Weight Gain. Not Present- Appetite Loss, Chills, Fever, Night Sweats and Weight Loss. Skin Present- Dryness. Not Present- Change in Wart/Mole, Hives, Jaundice, New Lesions, Non-Healing Wounds, Rash and Ulcer. HEENT Present- Hoarseness, Seasonal Allergies and Wears glasses/contact lenses. Not Present- Earache, Donald [name], Nose Bleed, Oral Ulcers, Ringing in the Ears, Sinus Pain, Sore Throat, Visual Disturbances and Yellow Eyes. Respiratory Present- Difficulty Breathing and Wheezing. Not Present- Bloody sputum, Chronic Cough and Snoring. Breast Not Present- Breast Mass, Breast Pain, Nipple Discharge and Skin Changes. Cardiovascular Present- Difficulty Breathing Lying Down, Leg Cramps, Shortness of Breath and Swelling of Extremities. Not Present- Chest Pain, Palpitations and Rapid Heart Rate. Gastrointestinal Present- Abdominal Pain, Bloating, Constipation, Difficulty Swallowing, Excessive gas, Hemorrhoids and Nausea. Not Present- Bloody Stool, Change in Bowel Habits, Chronic diarrhea, Gets full quickly at meals, Indigestion, Rectal Pain and Vomiting. Male Genitourinary Present- Frequency, Nocturia and Painful Urination. Not Present- Blood in Urine, Change in Urinary Stream, Impotence, Urgency and Urine Leakage. Musculoskeletal Present- Back Pain, Joint Pain, Joint Stiffness, Muscle Pain and Muscle Weakness. Not Present- Swelling of Extremities. Neurological Not Present- Decreased Memory, Fainting, Headaches, Numbness, Seizures, Tingling, Tremor, Trouble walking and Weakness. Psychiatric Not Present- Anxiety, Bipolar, Change in Sleep Pattern, Depression, Fearful and Frequent crying. Endocrine Not Present- Cold Intolerance, Excessive Hunger, Hair Changes, Heat Intolerance, Hot flashes and New Diabetes. Hematology Not Present- Blood Thinners, Easy Bruising, Excessive bleeding, Gland problems, HIV and Persistent Infections.  Vitals  Weight: 191 lb Height: 66in Body Surface Area: 1.96 m  Body Mass Index: 30.83 kg/m  Temp.: 98.39F(Oral)  Pulse: 94 (Regular)  BP: 140/82 (Sitting, Left Arm, Standard)       Physical Exam  General Mental Status-Alert. General Appearance-Consistent with stated age. Hydration-Well hydrated. Voice-Normal.  Head and Neck Head-normocephalic, atraumatic with no lesions or palpable masses. Trachea-midline. Thyroid Gland Characteristics - normal size and consistency.  Eye Eyeball - Bilateral-Extraocular movements intact. Sclera/Conjunctiva - Bilateral-No scleral icterus.  Chest and Lung Exam Chest and lung exam reveals -quiet, even and easy respiratory effort with no use of accessory muscles and on auscultation, normal breath sounds, no adventitious sounds and normal vocal resonance. Inspection Chest Wall - Normal. Back - normal.  Cardiovascular Cardiovascular examination reveals -normal heart sounds, regular rate and rhythm with no murmurs and normal pedal pulses bilaterally.  Abdomen Note: Abdomen soft. Borderline obese. Almost wonder if I can feel the liver edge. Not sure. Not really tender. Not distended. He has a short transverse scar left upper quadrant but she has says was a laceration. No hernias anywhere. Umbilicus feels normal.   Neurologic Neurologic evaluation reveals -alert and oriented x 3 with no impairment of recent or remote memory. Mental Status-Normal.  Musculoskeletal Normal Exam - Left-Upper Extremity Strength Normal and Lower Extremity Strength Normal. Normal Exam - Right-Upper Extremity Strength Normal and Lower Extremity Strength Normal.  Lymphatic Head & Neck  General Head & Neck Lymphatics: Bilateral - Description - Normal. Axillary  General Axillary Region: Bilateral - Description - Normal. Tenderness - Non Tender. Femoral & Inguinal  Generalized Femoral & Inguinal Lymphatics: Bilateral - Description - Normal. Tenderness - Non Tender.    Assessment &  Plan GALL STONES (K80.20)    You have been having episodes of upper abdominal  cramping, bloating and occasional nausea and vomiting. Fortunately, lab work performed last month is normal Dr. Henrene Pastor has done an extensive workup including upper endoscopy, colonoscopy, and screening for H. pylori infection The only significant findings are multiple colon polyps and he will need periodic colonoscopy in the future  Your ultrasound shows multiple gallstones and a borderline dilated bile duct system  Most likely your upper abdominal cramps and bloating are due to your multiple gallstones I have advised cholecystectomy you state that you would like to do this but would like to wait until April  I strongly advised low-fat diet I advised that you reduce the amount of alcohol and tobacco you are consuming I discussed the indications, details, and risk of cholecystectomy with you in detail Please read over the patient operation booklet that I reviewed with you  We will schedule the surgery at your convenience If the-abdominal pain gets worse, I suggest scheduling the surgery sooner  SMOKES TOBACCO DAILY (F17.200) ALCOHOL DRINKER (Z72.89) HISTORY OF COLON POLYPS (Z86.010) GASTRITIS (K29.70) HYPERTENSION, BENIGN (I10) BMI 30.0-30.9,ADULT (Z68.30) COPD, MODERATE (J44.9)    Canio Winokur M. Dalbert Batman, M.D., [facility] General and Minimally invasive Surgery Breast and Colorectal Surgery Office:   [phone] Pager:   [phone]

## 2018-11-04 ENCOUNTER — Ambulatory Visit (HOSPITAL_COMMUNITY)
Admission: RE | Admit: 2018-11-04 | Discharge: 2018-11-04 | Disposition: A | Discharge status: Home / Self Care | Payer: Medicare HMO | Attending: General Surgery | Admitting: General Surgery

## 2018-11-04 ENCOUNTER — Encounter (HOSPITAL_BASED_OUTPATIENT_CLINIC_OR_DEPARTMENT_OTHER)
Admission: RE | Disposition: A | Discharge status: Home / Self Care | Payer: Self-pay | Source: Home / Self Care | Attending: General Surgery

## 2018-11-04 ENCOUNTER — Ambulatory Visit (HOSPITAL_BASED_OUTPATIENT_CLINIC_OR_DEPARTMENT_OTHER): Payer: Medicare HMO | Admitting: Anesthesiology

## 2018-11-04 ENCOUNTER — Ambulatory Visit (HOSPITAL_COMMUNITY): Payer: Medicare HMO

## 2018-11-04 ENCOUNTER — Encounter (HOSPITAL_BASED_OUTPATIENT_CLINIC_OR_DEPARTMENT_OTHER): Payer: Self-pay | Admitting: *Deleted

## 2018-11-04 ENCOUNTER — Other Ambulatory Visit: Payer: Self-pay

## 2018-11-04 DIAGNOSIS — K8010 Calculus of gallbladder with chronic cholecystitis without obstruction: Secondary | ICD-10-CM | POA: Insufficient documentation

## 2018-11-04 DIAGNOSIS — K8020 Calculus of gallbladder without cholecystitis without obstruction: Secondary | ICD-10-CM | POA: Diagnosis present

## 2018-11-04 DIAGNOSIS — Z419 Encounter for procedure for purposes other than remedying health state, unspecified: Secondary | ICD-10-CM

## 2018-11-04 DIAGNOSIS — Z79899 Other long term (current) drug therapy: Secondary | ICD-10-CM | POA: Diagnosis not present

## 2018-11-04 DIAGNOSIS — I10 Essential (primary) hypertension: Secondary | ICD-10-CM | POA: Diagnosis not present

## 2018-11-04 DIAGNOSIS — J449 Chronic obstructive pulmonary disease, unspecified: Secondary | ICD-10-CM | POA: Diagnosis not present

## 2018-11-04 DIAGNOSIS — F17210 Nicotine dependence, cigarettes, uncomplicated: Secondary | ICD-10-CM | POA: Insufficient documentation

## 2018-11-04 HISTORY — DX: Calculus of gallbladder without cholecystitis without obstruction: K80.20

## 2018-11-04 HISTORY — PX: CHOLECYSTECTOMY: SHX55

## 2018-11-04 SURGERY — LAPAROSCOPIC CHOLECYSTECTOMY WITH INTRAOPERATIVE CHOLANGIOGRAM
Anesthesia: General | Site: Abdomen

## 2018-11-04 MED ORDER — SODIUM CHLORIDE 0.9 % IV SOLN
INTRAVENOUS | Status: DC | PRN
  Administered 2018-11-04: 8 mL

## 2018-11-04 MED ORDER — SUCCINYLCHOLINE CHLORIDE 200 MG/10ML IV SOSY
PREFILLED_SYRINGE | INTRAVENOUS | Status: AC
  Filled 2018-11-04: qty 10

## 2018-11-04 MED ORDER — ROCURONIUM BROMIDE 100 MG/10ML IV SOLN
INTRAVENOUS | Status: DC | PRN
  Administered 2018-11-04: 60 mg via INTRAVENOUS

## 2018-11-04 MED ORDER — EPHEDRINE 5 MG/ML INJ
INTRAVENOUS | Status: AC
  Filled 2018-11-04: qty 10

## 2018-11-04 MED ORDER — SUGAMMADEX SODIUM 200 MG/2ML IV SOLN
INTRAVENOUS | Status: AC
  Filled 2018-11-04: qty 2

## 2018-11-04 MED ORDER — ROCURONIUM BROMIDE 10 MG/ML (PF) SYRINGE
PREFILLED_SYRINGE | INTRAVENOUS | Status: AC
  Filled 2018-11-04: qty 10

## 2018-11-04 MED ORDER — ACETAMINOPHEN 160 MG/5ML PO SOLN: 325.0000 mg | ORAL | Status: DC | PRN

## 2018-11-04 MED ORDER — LIDOCAINE 2% (20 MG/ML) 5 ML SYRINGE
INTRAMUSCULAR | Status: AC
  Filled 2018-11-04: qty 5

## 2018-11-04 MED ORDER — FENTANYL CITRATE (PF) 100 MCG/2ML IJ SOLN
INTRAMUSCULAR | Status: AC
  Filled 2018-11-04: qty 2

## 2018-11-04 MED ORDER — MEPERIDINE HCL 25 MG/ML IJ SOLN: 6.2500 mg | INTRAMUSCULAR | Status: DC | PRN

## 2018-11-04 MED ORDER — PROPOFOL 10 MG/ML IV BOLUS
INTRAVENOUS | Status: DC | PRN
  Administered 2018-11-04: 200 mg via INTRAVENOUS

## 2018-11-04 MED ORDER — LACTATED RINGERS IV SOLN
INTRAVENOUS | Status: DC
  Administered 2018-11-04 (×2): via INTRAVENOUS

## 2018-11-04 MED ORDER — ACETAMINOPHEN 500 MG PO TABS
ORAL_TABLET | ORAL | Status: AC
  Filled 2018-11-04: qty 2

## 2018-11-04 MED ORDER — ONDANSETRON HCL 4 MG/2ML IJ SOLN
INTRAMUSCULAR | Status: DC | PRN
  Administered 2018-11-04: 4 mg via INTRAVENOUS

## 2018-11-04 MED ORDER — HYDRALAZINE HCL 20 MG/ML IJ SOLN
INTRAMUSCULAR | Status: AC
  Filled 2018-11-04: qty 1

## 2018-11-04 MED ORDER — CHLORHEXIDINE GLUCONATE CLOTH 2 % EX PADS: 6.0000 | MEDICATED_PAD | Freq: Once | CUTANEOUS | Status: DC

## 2018-11-04 MED ORDER — ONDANSETRON HCL 4 MG/2ML IJ SOLN: 4.0000 mg | Freq: Once | INTRAMUSCULAR | Status: DC | PRN

## 2018-11-04 MED ORDER — HEMOSTATIC AGENTS (NO CHARGE) OPTIME
TOPICAL | Status: DC | PRN
  Administered 2018-11-04: 1 via TOPICAL

## 2018-11-04 MED ORDER — FENTANYL CITRATE (PF) 100 MCG/2ML IJ SOLN
50.0000 ug | INTRAMUSCULAR | Status: AC | PRN
  Administered 2018-11-04: 100 ug via INTRAVENOUS
  Administered 2018-11-04 (×3): 50 ug via INTRAVENOUS

## 2018-11-04 MED ORDER — SCOPOLAMINE 1 MG/3DAYS TD PT72: 1.0000 | MEDICATED_PATCH | Freq: Once | TRANSDERMAL | Status: DC | PRN

## 2018-11-04 MED ORDER — LABETALOL HCL 5 MG/ML IV SOLN
INTRAVENOUS | Status: AC
  Filled 2018-11-04: qty 4

## 2018-11-04 MED ORDER — HYDROCODONE-ACETAMINOPHEN 5-325 MG PO TABS: 1.0000 | ORAL_TABLET | Freq: Four times a day (QID) | ORAL | 0 refills | Status: DC | PRN

## 2018-11-04 MED ORDER — BUPIVACAINE-EPINEPHRINE 0.5% -1:200000 IJ SOLN
INTRAMUSCULAR | Status: DC | PRN
  Administered 2018-11-04: 11 mL

## 2018-11-04 MED ORDER — CELECOXIB 200 MG PO CAPS
200.0000 mg | ORAL_CAPSULE | ORAL | Status: AC
  Administered 2018-11-04: 200 mg via ORAL

## 2018-11-04 MED ORDER — HYDRALAZINE HCL 20 MG/ML IJ SOLN
10.0000 mg | INTRAMUSCULAR | Status: AC
  Administered 2018-11-04: 10 mg via INTRAVENOUS

## 2018-11-04 MED ORDER — MIDAZOLAM HCL 2 MG/2ML IJ SOLN
1.0000 mg | INTRAMUSCULAR | Status: DC | PRN
  Administered 2018-11-04: 2 mg via INTRAVENOUS

## 2018-11-04 MED ORDER — CEFAZOLIN SODIUM-DEXTROSE 2-4 GM/100ML-% IV SOLN
2.0000 g | INTRAVENOUS | Status: AC
  Administered 2018-11-04: 2 g via INTRAVENOUS

## 2018-11-04 MED ORDER — GABAPENTIN 300 MG PO CAPS
300.0000 mg | ORAL_CAPSULE | ORAL | Status: AC
  Administered 2018-11-04: 300 mg via ORAL

## 2018-11-04 MED ORDER — SODIUM CHLORIDE 0.9 % IV SOLN: 250.0000 mL | INTRAVENOUS | Status: DC | PRN

## 2018-11-04 MED ORDER — SUGAMMADEX SODIUM 200 MG/2ML IV SOLN
INTRAVENOUS | Status: DC | PRN
  Administered 2018-11-04: 200 mg via INTRAVENOUS

## 2018-11-04 MED ORDER — SODIUM CHLORIDE 0.9 % IR SOLN
Status: DC | PRN
  Administered 2018-11-04: 1000 mL

## 2018-11-04 MED ORDER — GABAPENTIN 300 MG PO CAPS
ORAL_CAPSULE | ORAL | Status: AC
  Filled 2018-11-04: qty 1

## 2018-11-04 MED ORDER — OXYCODONE HCL 5 MG PO TABS: 5.0000 mg | ORAL_TABLET | ORAL | Status: DC | PRN

## 2018-11-04 MED ORDER — PHENYLEPHRINE 40 MCG/ML (10ML) SYRINGE FOR IV PUSH (FOR BLOOD PRESSURE SUPPORT)
PREFILLED_SYRINGE | INTRAVENOUS | Status: AC
  Filled 2018-11-04: qty 10

## 2018-11-04 MED ORDER — LACTATED RINGERS IV SOLN: INTRAVENOUS | Status: DC

## 2018-11-04 MED ORDER — LIDOCAINE HCL (CARDIAC) PF 100 MG/5ML IV SOSY
PREFILLED_SYRINGE | INTRAVENOUS | Status: DC | PRN
  Administered 2018-11-04: 100 mg via INTRAVENOUS

## 2018-11-04 MED ORDER — ACETAMINOPHEN 650 MG RE SUPP: 650.0000 mg | RECTAL | Status: DC | PRN

## 2018-11-04 MED ORDER — SODIUM CHLORIDE 0.9% FLUSH: 3.0000 mL | INTRAVENOUS | Status: DC | PRN

## 2018-11-04 MED ORDER — FENTANYL CITRATE (PF) 100 MCG/2ML IJ SOLN: 25.0000 ug | INTRAMUSCULAR | Status: DC | PRN

## 2018-11-04 MED ORDER — ACETAMINOPHEN 500 MG PO TABS
1000.0000 mg | ORAL_TABLET | ORAL | Status: AC
  Administered 2018-11-04: 1000 mg via ORAL

## 2018-11-04 MED ORDER — OXYCODONE HCL 5 MG/5ML PO SOLN: 5.0000 mg | Freq: Once | ORAL | Status: DC | PRN

## 2018-11-04 MED ORDER — CEFAZOLIN SODIUM-DEXTROSE 2-4 GM/100ML-% IV SOLN
INTRAVENOUS | Status: AC
  Filled 2018-11-04: qty 100

## 2018-11-04 MED ORDER — LABETALOL HCL 5 MG/ML IV SOLN
INTRAVENOUS | Status: DC | PRN
  Administered 2018-11-04: 5 mg via INTRAVENOUS

## 2018-11-04 MED ORDER — OXYCODONE HCL 5 MG PO TABS: 5.0000 mg | ORAL_TABLET | Freq: Once | ORAL | Status: DC | PRN

## 2018-11-04 MED ORDER — DEXAMETHASONE SODIUM PHOSPHATE 4 MG/ML IJ SOLN
INTRAMUSCULAR | Status: DC | PRN
  Administered 2018-11-04: 5 mg via INTRAVENOUS

## 2018-11-04 MED ORDER — ACETAMINOPHEN 325 MG PO TABS: 650.0000 mg | ORAL_TABLET | ORAL | Status: DC | PRN

## 2018-11-04 MED ORDER — ACETAMINOPHEN 325 MG PO TABS: 325.0000 mg | ORAL_TABLET | ORAL | Status: DC | PRN

## 2018-11-04 MED ORDER — MIDAZOLAM HCL 2 MG/2ML IJ SOLN
INTRAMUSCULAR | Status: AC
  Filled 2018-11-04: qty 2

## 2018-11-04 MED ORDER — DEXAMETHASONE SODIUM PHOSPHATE 10 MG/ML IJ SOLN
INTRAMUSCULAR | Status: AC
  Filled 2018-11-04: qty 1

## 2018-11-04 MED ORDER — ONDANSETRON HCL 4 MG/2ML IJ SOLN
INTRAMUSCULAR | Status: AC
  Filled 2018-11-04: qty 2

## 2018-11-04 MED ORDER — SODIUM CHLORIDE 0.9% FLUSH: 3.0000 mL | Freq: Two times a day (BID) | INTRAVENOUS | Status: DC

## 2018-11-04 MED ORDER — CELECOXIB 200 MG PO CAPS
ORAL_CAPSULE | ORAL | Status: AC
  Filled 2018-11-04: qty 1

## 2018-11-04 MED ORDER — FENTANYL CITRATE (PF) 100 MCG/2ML IJ SOLN
25.0000 ug | INTRAMUSCULAR | Status: DC | PRN
  Administered 2018-11-04: 50 ug via INTRAVENOUS

## 2018-11-04 SURGICAL SUPPLY — 39 items
APPLICATOR ARISTA FLEXITIP XL (MISCELLANEOUS) IMPLANT
APPLIER CLIP ROT 10 11.4 M/L (STAPLE) ×2
BLADE CLIPPER SURG (BLADE) IMPLANT
CANISTER SUCT 1200ML W/VALVE (MISCELLANEOUS) ×2 IMPLANT
CHLORAPREP W/TINT 26 (MISCELLANEOUS) ×2 IMPLANT
CLIP APPLIE ROT 10 11.4 M/L (STAPLE) ×1 IMPLANT
COVER MAYO STAND REUSABLE (DRAPES) IMPLANT
COVER WAND RF STERILE (DRAPES) IMPLANT
DECANTER SPIKE VIAL GLASS SM (MISCELLANEOUS) ×2 IMPLANT
DERMABOND ADVANCED (GAUZE/BANDAGES/DRESSINGS) ×1
DERMABOND ADVANCED .7 DNX12 (GAUZE/BANDAGES/DRESSINGS) ×1 IMPLANT
DRAPE C-ARM 42X72 X-RAY (DRAPES) IMPLANT
DRAPE LAPAROSCOPIC ABDOMINAL (DRAPES) ×2 IMPLANT
ELECT REM PT RETURN 9FT ADLT (ELECTROSURGICAL) ×2
ELECTRODE REM PT RTRN 9FT ADLT (ELECTROSURGICAL) ×1 IMPLANT
FILTER SMOKE EVAC LAPAROSHD (FILTER) IMPLANT
GLOVE EUDERMIC 7 POWDERFREE (GLOVE) ×2 IMPLANT
GOWN STRL REUS W/ TWL LRG LVL3 (GOWN DISPOSABLE) ×2 IMPLANT
GOWN STRL REUS W/ TWL XL LVL3 (GOWN DISPOSABLE) ×1 IMPLANT
GOWN STRL REUS W/TWL LRG LVL3 (GOWN DISPOSABLE) ×2
GOWN STRL REUS W/TWL XL LVL3 (GOWN DISPOSABLE) ×1
HEMOSTAT SNOW SURGICEL 2X4 (HEMOSTASIS) IMPLANT
NS IRRIG 1000ML POUR BTL (IV SOLUTION) IMPLANT
PACK BASIN DAY SURGERY FS (CUSTOM PROCEDURE TRAY) ×2 IMPLANT
POUCH SPECIMEN RETRIEVAL 10MM (ENDOMECHANICALS) ×2 IMPLANT
SCISSORS LAP 5X35 DISP (ENDOMECHANICALS) ×2 IMPLANT
SET CHOLANGIOGRAPH 5 50 .035 (SET/KITS/TRAYS/PACK) IMPLANT
SET IRRIG TUBING LAPAROSCOPIC (IRRIGATION / IRRIGATOR) ×2 IMPLANT
SET TUBE SMOKE EVAC HIGH FLOW (TUBING) ×2 IMPLANT
SLEEVE ENDOPATH XCEL 5M (ENDOMECHANICALS) ×2 IMPLANT
SLEEVE SCD COMPRESS KNEE MED (MISCELLANEOUS) ×2 IMPLANT
STRIP CLOSURE SKIN 1/2X4 (GAUZE/BANDAGES/DRESSINGS) IMPLANT
SUT MNCRL AB 4-0 PS2 18 (SUTURE) ×2 IMPLANT
SUT VICRYL 0 UR6 27IN ABS (SUTURE) IMPLANT
TOWEL GREEN STERILE FF (TOWEL DISPOSABLE) ×2 IMPLANT
TRAY LAPAROSCOPIC (CUSTOM PROCEDURE TRAY) ×2 IMPLANT
TROCAR XCEL BLUNT TIP 100MML (ENDOMECHANICALS) ×2 IMPLANT
TROCAR XCEL NON-BLD 11X100MML (ENDOMECHANICALS) ×2 IMPLANT
TROCAR XCEL NON-BLD 5MMX100MML (ENDOMECHANICALS) ×2 IMPLANT

## 2018-11-04 NOTE — Anesthesia Postprocedure Evaluation (Signed)
Anesthesia Post Note  Patient: Donald Martinez  Procedure(s) Performed: LAPAROSCOPIC CHOLECYSTECTOMY WITH INTRAOPERATIVE CHOLANGIOGRAM (N/A Abdomen)     Patient location during evaluation: PACU Anesthesia Type: General Level of consciousness: awake and alert Pain management: pain level controlled Vital Signs Assessment: post-procedure vital signs reviewed and stable Respiratory status: spontaneous breathing, nonlabored ventilation, respiratory function stable and patient connected to nasal cannula oxygen Cardiovascular status: blood pressure returned to baseline and stable Postop Assessment: no apparent nausea or vomiting Anesthetic complications: no    Last Vitals:  Vitals:   11/04/18 1115 11/04/18 1130  BP: (!) 179/103 (!) 186/106  Pulse: 84 83  Resp: 14 15  Temp:    SpO2: 100% 96%    Last Pain:  Vitals:   11/04/18 1115  TempSrc:   PainSc: 7                  Esti Demello

## 2018-11-04 NOTE — Anesthesia Procedure Notes (Signed)
Procedure Name: Intubation Date/Time: 11/04/2018 9:16 AM Performed by: Willa Frater, CRNA Pre-anesthesia Checklist: Patient identified, Emergency Drugs available, Suction available and Patient being monitored Patient Re-evaluated:Patient Re-evaluated prior to induction Oxygen Delivery Method: Circle system utilized Preoxygenation: Pre-oxygenation with 100% oxygen Induction Type: IV induction Ventilation: Mask ventilation without difficulty Grade View: Grade II Tube type: Oral Number of attempts: 1 Airway Equipment and Method: Stylet and Oral airway Placement Confirmation: ETT inserted through vocal cords under direct vision,  positive ETCO2 and breath sounds checked- equal and bilateral Secured at: 22 cm Tube secured with: Tape Dental Injury: Teeth and Oropharynx as per pre-operative assessment

## 2018-11-04 NOTE — Anesthesia Preprocedure Evaluation (Signed)
Anesthesia Evaluation  Patient identified by MRN, date of birth, ID band Patient awake    Reviewed: Allergy & Precautions, H&P , NPO status , Patient's Chart, lab work & pertinent test results, reviewed documented beta blocker date and time   Airway Mallampati: II  TM Distance: >3 FB Neck ROM: full    Dental no notable dental hx.    Pulmonary COPD, former smoker,    Pulmonary exam normal breath sounds clear to auscultation       Cardiovascular Exercise Tolerance: Good hypertension, Pt. on medications negative cardio ROS   Rhythm:regular Rate:Normal     Neuro/Psych negative neurological ROS  negative psych ROS   GI/Hepatic negative GI ROS, (+)     substance abuse  alcohol use,   Endo/Other  negative endocrine ROS  Renal/GU negative Renal ROS  negative genitourinary   Musculoskeletal   Abdominal   Peds  Hematology negative hematology ROS (+)   Anesthesia Other Findings SMOKES TOBACCO DAILY  ALCOHOL DRINKER  HISTORY OF COLON POLYPS  GASTRITIS HYPERTENSION, BENIGN   COPD, MODERATE    Reproductive/Obstetrics negative OB ROS                             Anesthesia Physical Anesthesia Plan  ASA: III  Anesthesia Plan: General   Post-op Pain Management:    Induction: Intravenous  PONV Risk Score and Plan:   Airway Management Planned: Oral ETT  Additional Equipment:   Intra-op Plan:   Post-operative Plan: Extubation in OR  Informed Consent: I have reviewed the patients History and Physical, chart, labs and discussed the procedure including the risks, benefits and alternatives for the proposed anesthesia with the patient or authorized representative who has indicated his/her understanding and acceptance.     Dental Advisory Given  Plan Discussed with: CRNA, Anesthesiologist and Surgeon  Anesthesia Plan Comments: (  )        Anesthesia Quick Evaluation

## 2018-11-04 NOTE — Discharge Instructions (Signed)
[facility] ______[facility] °LAPAROSCOPIC SURGERY: POST OP INSTRUCTIONS °Always review your discharge instruction sheet given to you by the facility where your surgery was performed. °IF YOU HAVE DISABILITY OR FAMILY LEAVE FORMS, YOU MUST BRING THEM TO THE OFFICE FOR PROCESSING.   °DO NOT GIVE THEM TO YOUR DOCTOR. ° °1. A prescription for pain medication may be given to you upon discharge.  Take your pain medication as prescribed, if needed.  If narcotic pain medicine is not needed, then you may take acetaminophen (Tylenol) or ibuprofen (Advil) as needed. °2. Take your usually prescribed medications unless otherwise directed. °3. If you need a refill on your pain medication, please contact your pharmacy.  They will contact our office to request authorization. Prescriptions will not be filled after 5pm or on week-ends. °4. You should follow a light diet the first few days after arrival home, such as soup and crackers, etc.  Be sure to include lots of fluids daily. °5. Most patients will experience some swelling and bruising in the area of the incisions.  Ice packs will help.  Swelling and bruising can take several days to resolve.  °6. It is common to experience some constipation if taking pain medication after surgery.  Increasing fluid intake and taking a stool softener (such as Colace) will usually help or prevent this problem from occurring.  A mild laxative (Milk of Magnesia or Miralax) should be taken according to package instructions if there are no bowel movements after 48 hours. °7. Unless discharge instructions indicate otherwise, you may remove your bandages 24-48 hours after surgery, and you may shower at that time.  You may have steri-strips (small skin tapes) in place directly over the incision.  These strips should be left on the skin for 7-10 days.  If your surgeon used skin glue on the incision, you may shower in 24 hours.  The glue will flake off over the next 2-3 weeks.  Any sutures or  staples will be removed at the office during your follow-up visit. °8. ACTIVITIES:  You may resume regular (light) daily activities beginning the next day--such as daily self-care, walking, climbing stairs--gradually increasing activities as tolerated.  You may have sexual intercourse when it is comfortable.  Refrain from any heavy lifting or straining until approved by your doctor. °a. You may drive when you are no longer taking prescription pain medication, you can comfortably wear a seatbelt, and you can safely maneuver your car and apply brakes. °b. RETURN TO WORK:  __________________________________________________________ °9. You should see your doctor in the office for a follow-up appointment approximately 2-3 weeks after your surgery.  Make sure that you call for this appointment within a day or two after you arrive home to insure a convenient appointment time. °10. OTHER INSTRUCTIONS: __________________________________________________________________________________________________________________________ __________________________________________________________________________________________________________________________ °WHEN TO CALL YOUR DOCTOR: °1. Fever over 101.0 °2. Inability to urinate °3. Continued bleeding from incision. °4. Increased pain, redness, or drainage from the incision. °5. Increasing abdominal pain ° °The clinic staff is available to answer your questions during regular business hours.  Please don’t hesitate to call and ask to speak to one of the nurses for clinical concerns.  If you have a medical emergency, go to the nearest emergency room or call 911.  A surgeon from [facility] is always on call at the hospital. °[address], [address], [place], [place]  [address] ? [address] [address], [place],    [address] °[phone] ? [phone] ? FAX [phone] °Web site:   [email or web address] ° °••••••••• ° ° °Managing Your Pain After Surgery Without  Opioids ° ° ° °Thank you for participating in our program to help patients manage their pain after surgery without opioids. This is part of our effort to provide you with the best care possible, without exposing you or your family to the risk that opioids pose. ° °What pain can I expect after surgery? °You can expect to have some pain after surgery. This is normal. The pain is typically worse the day after surgery, and quickly begins to get better. °Many studies have found that many patients are able to manage their pain after surgery with Over-the-Counter (OTC) medications such as Tylenol and Motrin. If you have a condition that does not allow you to take Tylenol or Motrin, notify your surgical team. ° °How will I manage my pain? °The best strategy for controlling your pain after surgery is around the clock pain control with Tylenol (acetaminophen) and Motrin (ibuprofen or Advil). Alternating these medications with each other allows you to maximize your pain control. In addition to Tylenol and Motrin, you can use heating pads or ice packs on your incisions to help reduce your pain. ° °How will I alternate your regular strength over-the-counter pain medication? °You will take a dose of pain medication every three hours. °; Start by taking 650 mg of Tylenol (2 pills of 325 mg) °; 3 hours later take 600 mg of Motrin (3 pills of 200 mg) °; 3 hours after taking the Motrin take 650 mg of Tylenol °; 3 hours after that take 600 mg of Motrin. ° ° °- 1 - ° °See example - if your first dose of Tylenol is at 12:00 PM ° ° °12:00 PM Tylenol 650 mg (2 pills of 325 mg)  °3:00 PM Motrin 600 mg (3 pills of 200 mg)  °6:00 PM Tylenol 650 mg (2 pills of 325 mg)  °9:00 PM Motrin 600 mg (3 pills of 200 mg)  °Continue alternating every 3 hours  ° °We recommend that you follow this schedule around-the-clock for at least 3 days after surgery, or until you feel that it is no longer needed. Use the table on the last page of this handout to  keep track of the medications you are taking. °Important: °Do not take more than 3000mg of Tylenol or 3200mg of Motrin in a 24-hour period. °Do not take ibuprofen/Motrin if you have a history of bleeding stomach ulcers, severe kidney disease, &/or actively taking a blood thinner ° °What if I still have pain? °If you have pain that is not controlled with the over-the-counter pain medications (Tylenol and Motrin or Advil) you might have what we call “breakthrough” pain. You will receive a prescription for a small amount of an opioid pain medication such as Oxycodone, Tramadol, or Tylenol with Codeine. Use these opioid pills in the first 24 hours after surgery if you have breakthrough pain. Do not take more than 1 pill every 4-6 hours. ° °If you still have uncontrolled pain after using all opioid pills, don't hesitate to call our staff using the number provided. We will help make sure you are managing your pain in the best way possible, and if necessary, we can provide a prescription for additional pain medication. ° ° °Day 1   ° °Time  °Name of Medication Number of pills taken  °Amount of Acetaminophen  °Pain Level  ° °Comments  °AM PM       °AM PM       °  AM PM       °AM PM       °AM PM       °AM PM       °AM PM       °AM PM       °Total Daily amount of Acetaminophen °Do not take more than  3,000 mg per day    ° ° °Day 2   ° °Time  °Name of Medication Number of pills °taken  °Amount of Acetaminophen  °Pain Level  ° °Comments  °AM PM       °AM PM       °AM PM       °AM PM       °AM PM       °AM PM       °AM PM       °AM PM       °Total Daily amount of Acetaminophen °Do not take more than  3,000 mg per day    ° ° °Day 3   ° °Time  °Name of Medication Number of pills taken  °Amount of Acetaminophen  °Pain Level  ° °Comments  °AM PM       °AM PM       °AM PM       °AM PM       ° ° ° °AM PM       °AM PM       °AM PM       °AM PM       °Total Daily amount of Acetaminophen °Do not take more than  3,000 mg per day    ° ° °Day  4   ° °Time  °Name of Medication Number of pills taken  °Amount of Acetaminophen  °Pain Level  ° °Comments  °AM PM       °AM PM       °AM PM       °AM PM       °AM PM       °AM PM       °AM PM       °AM PM       °Total Daily amount of Acetaminophen °Do not take more than  3,000 mg per day    ° ° °Day 5   ° °Time  °Name of Medication Number °of pills taken  °Amount of Acetaminophen  °Pain Level  ° °Comments  °AM PM       °AM PM       °AM PM       °AM PM       °AM PM       °AM PM       °AM PM       °AM PM       °Total Daily amount of Acetaminophen °Do not take more than  3,000 mg per day    ° ° ° °Day 6   ° °Time  °Name of Medication Number of pills °taken  °Amount of Acetaminophen  °Pain Level  °Comments  °AM PM       °AM PM       °AM PM       °AM PM       °AM PM       °AM PM       °AM PM       °AM PM       °Total Daily amount   Total Daily amount of Acetaminophen Do not take more than  3,000 mg per day      Day 7    Time  Name of Medication Number of pills taken  Amount of Acetaminophen  Pain Level   Comments  AM PM       AM PM       AM PM       AM PM       AM PM       AM PM       AM PM       AM PM       Total Daily amount of Acetaminophen Do not take more than  3,000 mg per day        For additional information about how and where to safely dispose of unused opioid medications - [employer]  Disclaimer: This document contains information and/or instructional materials adapted from [employer] for the typical patient with your condition. It does not replace medical advice from your health care provider because your experience may differ from that of the typical patient. Talk to your health care provider if you have any questions about this document, your condition or your treatment plan. Adapted from [facility] Instructions  Activity: Get plenty of rest for the remainder of the day. A responsible individual must stay with you  for 24 hours following the procedure.  For the next 24 hours, DO NOT: -Drive a car -Paediatric nurse -Drink alcoholic beverages -Take any medication unless instructed by your physician -Make any legal decisions or sign important papers.  Meals: Start with liquid foods such as gelatin or soup. Progress to regular foods as tolerated. Avoid greasy, spicy, heavy foods. If nausea and/or vomiting occur, drink only clear liquids until the nausea and/or vomiting subsides. Call your physician if vomiting continues.  Special Instructions/Symptoms: Your throat may feel dry or sore from the anesthesia or the breathing tube placed in your throat during surgery. If this causes discomfort, gargle with warm salt water. The discomfort should disappear within 24 hours.

## 2018-11-04 NOTE — Interval H&P Note (Signed)
History and Physical Interval Note:  11/04/2018 7:05 AM  Donald Martinez  has presented today for surgery, with the diagnosis of gallstones.  The various methods of treatment have been discussed with the patient and family. After consideration of risks, benefits and other options for treatment, the patient has consented to  Procedure(s): LAPAROSCOPIC CHOLECYSTECTOMY WITH INTRAOPERATIVE CHOLANGIOGRAM ERAS PATHWAY (N/A) as a surgical intervention.  The patient's history has been reviewed, patient examined, no change in status, stable for surgery.  I have reviewed the patient's chart and labs.  Questions were answered to the patient's satisfaction.     [name]

## 2018-11-04 NOTE — Transfer of Care (Signed)
Immediate Anesthesia Transfer of Care Note  Patient: Myles Gip  Procedure(s) Performed: LAPAROSCOPIC CHOLECYSTECTOMY WITH INTRAOPERATIVE CHOLANGIOGRAM (N/A Abdomen)  Patient Location: PACU  Anesthesia Type:General  Level of Consciousness: awake, alert , oriented and drowsy  Airway & Oxygen Therapy: Patient Spontanous Breathing and Patient connected to face mask oxygen  Post-op Assessment: Report given to RN and Post -op Vital signs reviewed and stable  Post vital signs: Reviewed and stable  Last Vitals:  Vitals Value Taken Time  BP 175/105 11/04/2018 10:52 AM  Temp    Pulse 82 11/04/2018 10:54 AM  Resp 17 11/04/2018 10:54 AM  SpO2 100 % 11/04/2018 10:54 AM  Vitals shown include unvalidated device data.  Last Pain:  Vitals:   11/04/18 0703  TempSrc: Oral      Patients Stated Pain Goal: 0 (97/58/83 2549)  Complications: No apparent anesthesia complications

## 2018-11-04 NOTE — Op Note (Addendum)
Patient Name:           Donald Martinez   Date of Surgery:        11/04/2018  Pre op Diagnosis:    Chronic cholecystitis with cholelithiasis  Post op Diagnosis:    Same  Procedure:                 Laparoscopic cholecystectomy with cholangiogram  Surgeon:                     Edsel Petrin. Dalbert Batman, M.D., FACS  Assistant:                      Rolm Bookbinder, MD   Indication for Assistant: Difficult exposure, obese patient, assist with technique and expedite case  Operative Indications:  . This is a 68 year old gentleman, referred by Dr. Scarlette Shorts for management of gallstones. His PCP is listed as Dustin Folks, in pain. Sallee Lange is also listed.      The patient is several year history of intermittent episodes of upper abdominal cramping and bloating. Can be right or left side. Can go to the back. Not necessarily related to meals. Diarrhea is rare. Occasionally but not often he has nausea and vomiting associated with this. He says is now having 2-3 times a week. Extensive workup by Dr. Scarlette Shorts recently. Upper endoscopy showed mild gastritis. H. pylori testing negative. CBC, complete metabolic panel, lipase normal on July 21, 2018.  Colonoscopy shows numerous polyps some inflammatory and some adenomatous. Follow-up colonoscopy is to be arranged.  Ultrasound shows several gallstones. CBD 8 mm. No inflammatory changes.      Past history significant for daily tobacco and alcohol use. He says he has significantly slowed down and almost quit smoking alcohol for the past 6 weeks. He says his tobacco intake is down to less than a quarter pack per day. BMI 30. COPD. Hypertension. No abdominal surgery      I discussed his GI workup. I told him that it was quite likely that his symptoms were due to gallstones although not 759% certain. Considering the workup and findings to date, I advised him to have elective cholecystectomy. He agrees that he wants to do this.  Symptoms have  been more frequent recently and so we expedited his case. We will schedule him  for laparoscopic cholecystectomy with cholangiogram. .     Recently he has been having accelerating symptoms so we will move his surgery up.  Operative Findings:       The gallbladder was chronically inflamed, somewhat discolored.  There were extensive adhesions to the gallbladder which took some time to take down.  The liver was somewhat large and heavy.  The anatomy of the cystic duct, cystic artery, and common bile duct were conventional.  The cholangiogram was normal, showing normal intrahepatic and extrahepatic biliary anatomy, no filling defect, and no obstruction with good flow of contrast into the duodenum.  Four-quadrant inspection at the beginning of the case and at the end of the case showed no other pathology and no bleeding or injury.  Procedure in Detail:          Following the induction of general endotracheal anesthesia the patient's abdomen was prepped and draped in a sterile fashion.  Surgical timeout was performed.  Intravenous antibiotics were given.  0.5% Marcaine was used as a local infiltration anesthetic.      A vertical incision was made in the upper rim of  the umbilicus.  The fascia was incised in the midline of the abdominal cavity entered under direct vision.  11 mm Hassan trocar was inserted and secured with a pursestring suture of 0 Vicryl.  Pneumoperitoneum was created.  Video camera was inserted with visualization and findings as described above.  Closed smoke evacuation system was put in place.  A trocar was placed in the subxiphoid region and two 5 mm trochars in the right upper quadrant.  We could see the gallbladder fundus.  We elevated this.  We slowly took the adhesions down bringing the omentum and colon and duodenum down off of the gallbladder.  Although complex the adhesions were soft.  This was uneventful.  We retracted the infundibulum laterally.  We created a large window and critical  view behind the cystic duct.  We isolated an anterior branch and a posterior branch of the cystic artery, controlled these with metal clips and divided these.  A cholangiogram catheter was inserted into the cystic duct and a cholangiogram obtained using the C arm.  The cholangiogram was normal as described above.  The cholangiogram catheter was removed, the cystic duct secured with 3 metal clips and divided.  The gallbladder was then dissected from its bed with electrocautery placed in a specimen bag and removed.  The operative field was copiously irrigated.  The gallbladder bed was a little bit raw and required electrocautery and snow hemostatic sponge but at the end there was no bleeding and no bile leak.      Once we were satisfied we slowly about the pneumoperitoneum evacuate using a smoke evacuation system.  The trochars were removed.  The fascia at the umbilicus was closed with 0 Vicryl sutures.  The skin incisions were closed with subcuticular sutures of 4-0 Monocryl and Dermabond.  The patient tolerated the procedure well and was taken to PACU in stable condition.  EBL 25 cc.  Counts correct.  Complications none.   Addendum: I logged onto the PMP aware website and reviewed his prescription medication history.     Edsel Petrin. Dalbert Batman, M.D., FACS General and Minimally Invasive Surgery Breast and Colorectal Surgery  11/04/2018 10:34 AM \

## 2018-11-05 ENCOUNTER — Encounter (HOSPITAL_BASED_OUTPATIENT_CLINIC_OR_DEPARTMENT_OTHER): Payer: Self-pay | Admitting: General Surgery

## 2018-11-05 NOTE — Addendum Note (Signed)
Addendum  created 11/05/18 1158 by Tawni Millers, CRNA   Charge Capture section accepted, Visit diagnoses modified

## 2019-01-06 ENCOUNTER — Encounter: Payer: Self-pay | Admitting: Internal Medicine

## 2019-01-11 ENCOUNTER — Encounter: Payer: Self-pay | Admitting: Internal Medicine

## 2019-02-03 ENCOUNTER — Other Ambulatory Visit: Payer: Self-pay

## 2019-02-03 ENCOUNTER — Ambulatory Visit (AMBULATORY_SURGERY_CENTER): Payer: Self-pay | Admitting: *Deleted

## 2019-02-03 VITALS — Ht 66.0 in | Wt 193.0 lb

## 2019-02-03 DIAGNOSIS — Z86010 Personal history of colonic polyps: Secondary | ICD-10-CM

## 2019-02-03 MED ORDER — NA SULFATE-K SULFATE-MG SULF 17.5-3.13-1.6 GM/177ML PO SOLN: ORAL | 0 refills | Status: AC

## 2019-02-03 NOTE — Progress Notes (Signed)
Patient's pre-visit was done today over the phone with the patient due to COVID-19 pandemic. Name,DOB and address verified. Insurance verified. Packet of Prep instructions mailed to patient including copy of a consent form and pre-procedure patient acknowledgement form-pt is aware.  Patient understands to call us back with any questions or concerns.  Patient denies any allergies to eggs or soy. Patient denies any problems with anesthesia/sedation. Patient denies any oxygen use at home. Patient denies taking any diet/weight loss medications or blood thinners. Pt is aware that care partner will wait in the car during procedure; if they feel like they will be too hot to wait in the car; they may wait in the lobby.  We want them to wear a mask (we do not have any that we can provide them), practice social distancing, and we will check their temperatures when they get here.  I did remind patient that their care partner needs to stay in the parking lot the entire time. Pt will wear mask into building. 

## 2019-02-16 ENCOUNTER — Telehealth: Payer: Self-pay | Admitting: Internal Medicine

## 2019-02-16 NOTE — Telephone Encounter (Signed)

## 2019-02-17 ENCOUNTER — Other Ambulatory Visit: Payer: Self-pay

## 2019-02-17 ENCOUNTER — Encounter: Payer: Self-pay | Admitting: Internal Medicine

## 2019-02-17 ENCOUNTER — Ambulatory Visit (AMBULATORY_SURGERY_CENTER): Payer: Medicare HMO | Admitting: Internal Medicine

## 2019-02-17 VITALS — BP 169/92 | HR 66 | Temp 97.3°F | Resp 20 | Ht 66.0 in | Wt 193.0 lb

## 2019-02-17 DIAGNOSIS — K5140 Inflammatory polyps of colon without complications: Secondary | ICD-10-CM | POA: Diagnosis not present

## 2019-02-17 DIAGNOSIS — Z86010 Personal history of colonic polyps: Secondary | ICD-10-CM

## 2019-02-17 DIAGNOSIS — K635 Polyp of colon: Secondary | ICD-10-CM

## 2019-02-17 DIAGNOSIS — D122 Benign neoplasm of ascending colon: Secondary | ICD-10-CM

## 2019-02-17 DIAGNOSIS — D123 Benign neoplasm of transverse colon: Secondary | ICD-10-CM

## 2019-02-17 MED ORDER — SODIUM CHLORIDE 0.9 % IV SOLN: 500.0000 mL | Freq: Once | INTRAVENOUS | Status: DC

## 2019-02-17 NOTE — Progress Notes (Signed)
Called to room to assist during endoscopic procedure.  Patient ID and intended procedure confirmed with present staff. Received instructions for my participation in the procedure from the performing physician.  

## 2019-02-17 NOTE — Patient Instructions (Signed)
Discharge instructions given. Handout on polyps. Resume previous medications. YOU HAD AN ENDOSCOPIC PROCEDURE TODAY AT THE [facility]:   Refer to the procedure report that was given to you for any specific questions about what was found during the examination.  If the procedure report does not answer your questions, please call your gastroenterologist to clarify.  If you requested that your care partner not be given the details of your procedure findings, then the procedure report has been included in a sealed envelope for you to review at your convenience later.  YOU SHOULD EXPECT: Some feelings of bloating in the abdomen. Passage of more gas than usual.  Walking can help get rid of the air that was put into your GI tract during the procedure and reduce the bloating. If you had a lower endoscopy (such as a colonoscopy or flexible sigmoidoscopy) you may notice spotting of blood in your stool or on the toilet paper. If you underwent a bowel prep for your procedure, you may not have a normal bowel movement for a few days.  Please Note:  You might notice some irritation and congestion in your nose or some drainage.  This is from the oxygen used during your procedure.  There is no need for concern and it should clear up in a day or so.  SYMPTOMS TO REPORT IMMEDIATELY:   Following lower endoscopy (colonoscopy or flexible sigmoidoscopy):  Excessive amounts of blood in the stool  Significant tenderness or worsening of abdominal pains  Swelling of the abdomen that is new, acute  Fever of 100F or higher   For urgent or emergent issues, a gastroenterologist can be reached at any hour by calling [phone].   DIET:  We do recommend a small meal at first, but then you may proceed to your regular diet.  Drink plenty of fluids but you should avoid alcoholic beverages for 24 hours.  ACTIVITY:  You should plan to take it easy for the rest of today and you should NOT DRIVE or use heavy  machinery until tomorrow (because of the sedation medicines used during the test).    FOLLOW UP: Our staff will call the number listed on your records 48-72 hours following your procedure to check on you and address any questions or concerns that you may have regarding the information given to you following your procedure. If we do not reach you, we will leave a message.  We will attempt to reach you two times.  During this call, we will ask if you have developed any symptoms of COVID 19. If you develop any symptoms (ie: fever, flu-like symptoms, shortness of breath, cough etc.) before then, please call [phone].  If you test positive for Covid 19 in the 2 weeks post procedure, please call and report this information to us.    If any biopsies were taken you will be contacted by phone or by letter within the next 1-3 weeks.  Please call us at [phone] if you have not heard about the biopsies in 3 weeks.    SIGNATURES/CONFIDENTIALITY: You and/or your care partner have signed paperwork which will be entered into your electronic medical record.  These signatures attest to the fact that that the information above on your After Visit Summary has been reviewed and is understood.  Full responsibility of the confidentiality of this discharge information lies with you and/or your care-partner. 

## 2019-02-17 NOTE — Progress Notes (Signed)
Pt's states no medical or surgical changes since previsit or office visit. 

## 2019-02-17 NOTE — Progress Notes (Signed)
Temperature taken by J.B., CMA, VS taken by C.W., CMA

## 2019-02-17 NOTE — Progress Notes (Signed)
To PACU, VSS. Report to [email or web address] 

## 2019-02-17 NOTE — Op Note (Addendum)
[facility] Patient Name: Donald Martinez Procedure Date: 02/17/2019 8:09 AM MRN: [id number] Endoscopist: Docia Chuck. Henrene Pastor , MD Age: 68 Referring MD:  Date of Birth: 07-Jun-1951 Gender: Male Account #: [id number] Procedure:                Colonoscopy with cold snare polypectomy x 3; with                            biopsies Indications:              High risk colon cancer surveillance: Personal                            history of multiple (3 or more) adenomas. Previous                            examination that January 2020. In addition to                            adenomatous polyps, 2 areas with irregular tissue                            that were biopsied and returned as benign                            inflammatory tissue. These areas were tattooed.                            Reinspection today Medicines:                Monitored Anesthesia Care Procedure:                Pre-Anesthesia Assessment:                           - Prior to the procedure, a History and Physical                            was performed, and patient medications and                            allergies were reviewed. The patient's tolerance of                            previous anesthesia was also reviewed. The risks                            and benefits of the procedure and the sedation                            options and risks were discussed with the patient.                            All questions were answered, and informed consent  was obtained. Prior Anticoagulants: The patient has                            taken no previous anticoagulant or antiplatelet                            agents. ASA Grade Assessment: II - A patient with                            mild systemic disease. After reviewing the risks                            and benefits, the patient was deemed in                            satisfactory condition to undergo the procedure.                After obtaining informed consent, the colonoscope                            was passed under direct vision. Throughout the                            procedure, the patient's blood pressure, pulse, and                            oxygen saturations were monitored continuously. The                            Colonoscope was introduced through the anus and                            advanced to the the cecum, identified by                            appendiceal orifice and ileocecal valve. The                            ileocecal valve, appendiceal orifice, and rectum                            were photographed. The quality of the bowel                            preparation was poor. The colonoscopy was performed                            without difficulty. The patient tolerated the                            procedure well. The bowel preparation used was                            SUPREP via split dose instruction.  Scope In: 8:11:15 AM Scope Out: 8:29:00 AM Scope Withdrawal Time: 0 hours 14 minutes 29 seconds  Total Procedure Duration: 0 hours 17 minutes 45 seconds  Findings:                 Three polyps were found in the transverse colon and                            ascending colon. The polyps were 3 to 5 mm in size.                            These polyps were removed with a cold snare.                            Resection and retrieval were complete.                           The areas of previous concern were easily                            identified by marking tattoos. The area of the                            transverse colon had residual irregular looking                            tissue which was again biopsied. The entire                            examined colon appeared normal on direct and                            retroflexion views, though the examination was                            compromised by the poor preparation (dense                             vegetation material throughout). Complications:            No immediate complications. Estimated blood loss:                            None. Estimated Blood Loss:     Estimated blood loss: none. Impression:               - Preparation of the colon was poor.                           - Three 3 to 5 mm polyps in the transverse colon                            and in the ascending colon, removed with a cold  snare. Resected and retrieved.                           - The entire examined colon is normal on direct and                            retroflexion views. Recommendation:           - Repeat colonoscopy in 1 year for surveillance                            (poor prep). PLEASE PAY ATTENTION TO PREP                            INSTRUCTIONS to optimize visualization.                           - Patient has a contact number available for                            emergencies. The signs and symptoms of potential                            delayed complications were discussed with the                            patient. Return to normal activities tomorrow.                            Written discharge instructions were provided to the                            patient.                           - Resume previous diet.                           - Continue present medications.                           - Await pathology results. Docia Chuck. Henrene Pastor, MD 02/17/2019 8:44:20 AM This report has been signed electronically.

## 2019-02-19 ENCOUNTER — Encounter: Payer: Self-pay | Admitting: Internal Medicine

## 2019-02-19 ENCOUNTER — Telehealth: Payer: Self-pay

## 2019-02-19 NOTE — Telephone Encounter (Signed)
  Follow up Call-  Call back number 02/17/2019 08/04/2018  Post procedure Call Back phone  # [phone]  Permission to leave phone message Yes Yes  Some recent data might be hidden     Patient questions:  Do you have a fever, pain , or abdominal swelling? No. Pain Score  0 *  Have you tolerated food without any problems? Yes.    Have you been able to return to your normal activities? Yes.    Do you have any questions about your discharge instructions: Diet   No. Medications  No. Follow up visit  No.  Do you have questions or concerns about your Care? No.  Actions: * If pain score is 4 or above: No action needed, pain <4. Covid-19 screening questions    Do you now or have you had a fever in the last 14 days?no  Do you have any respiratory symptoms of shortness of breath or cough now or in the last 14 days?no  Do you have any family members or close contacts with diagnosed or suspected Covid-19 in the past 14 days?no  Have you been tested for Covid-19 and found to be positive?no

## 2019-05-20 ENCOUNTER — Other Ambulatory Visit: Payer: Self-pay | Admitting: Family

## 2019-05-20 DIAGNOSIS — I10 Essential (primary) hypertension: Secondary | ICD-10-CM

## 2019-05-25 ENCOUNTER — Other Ambulatory Visit: Payer: Medicare HMO

## 2019-06-01 ENCOUNTER — Ambulatory Visit
Admission: RE | Admit: 2019-06-01 | Discharge: 2019-06-01 | Disposition: A | Discharge status: Home / Self Care | Payer: Medicare HMO | Source: Ambulatory Visit | Attending: Family | Admitting: Family

## 2019-06-01 DIAGNOSIS — I10 Essential (primary) hypertension: Secondary | ICD-10-CM

## 2019-07-30 ENCOUNTER — Other Ambulatory Visit (HOSPITAL_COMMUNITY): Payer: Self-pay | Admitting: Family

## 2019-07-30 DIAGNOSIS — I10 Essential (primary) hypertension: Secondary | ICD-10-CM

## 2019-08-12 ENCOUNTER — Ambulatory Visit (HOSPITAL_COMMUNITY): Payer: Medicare HMO | Attending: Cardiology

## 2019-08-12 ENCOUNTER — Other Ambulatory Visit: Payer: Self-pay

## 2019-08-12 DIAGNOSIS — I10 Essential (primary) hypertension: Secondary | ICD-10-CM | POA: Insufficient documentation

## 2020-07-27 ENCOUNTER — Encounter: Payer: Medicare HMO | Admitting: Internal Medicine

## 2020-07-27 IMAGING — US US RENAL
1 series · 14 of 25 positions shown · non-contrast
Comparison: July 24, 2018 abdominal ultrasound

CLINICAL DATA: Hypertension

EXAM:
RENAL / URINARY TRACT ULTRASOUND COMPLETE

[Series 1: us renal · 0.19mm/px · 14 of 40 slices shown]
[im 1/40]
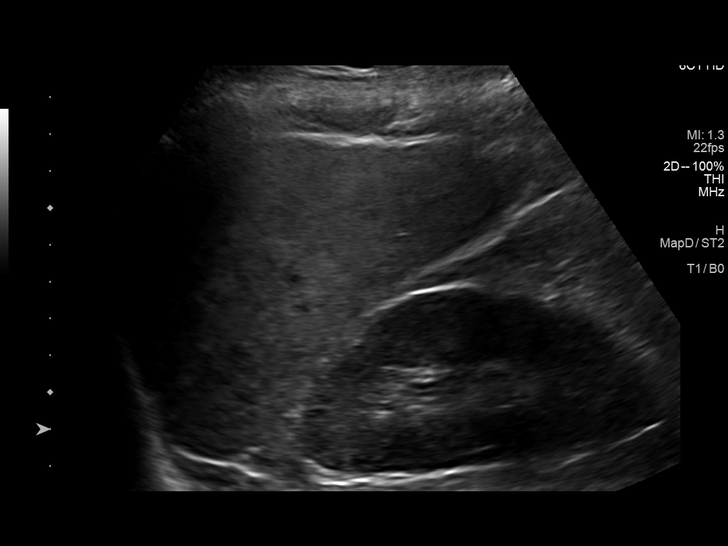
[im 4/40]
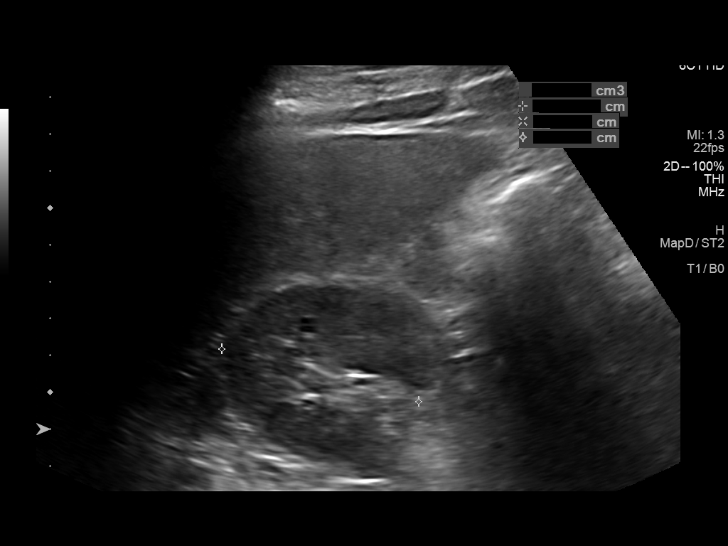
[im 7/40]
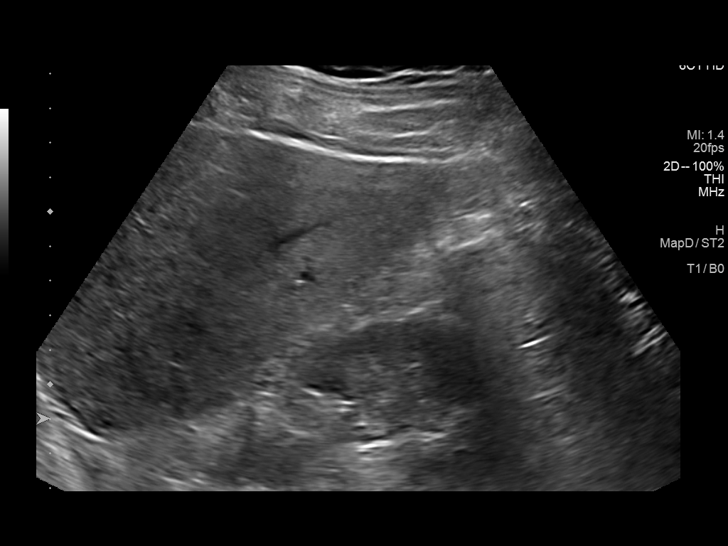
[im 10/40]
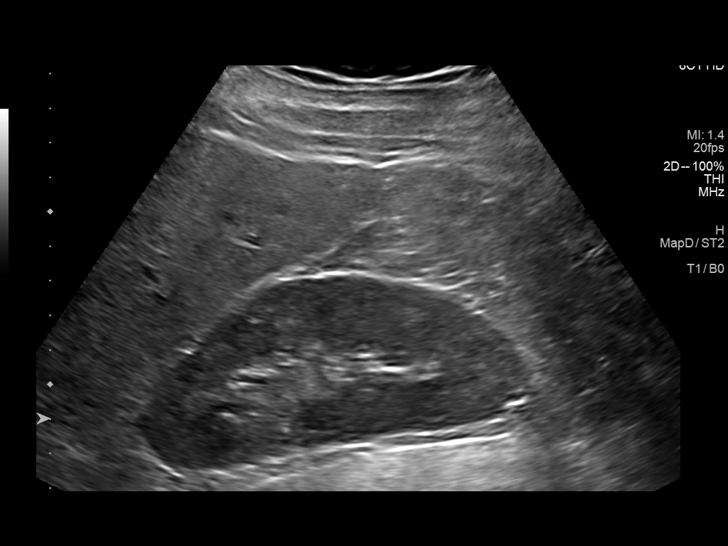
[im 14/40]
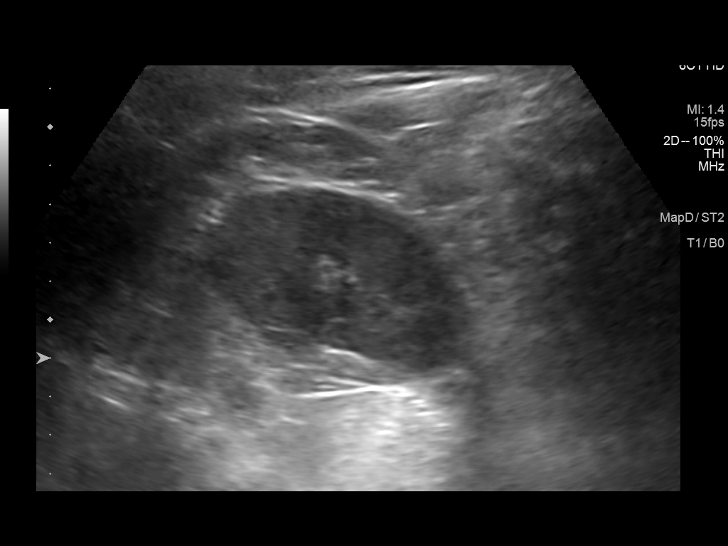
[im 15/40]
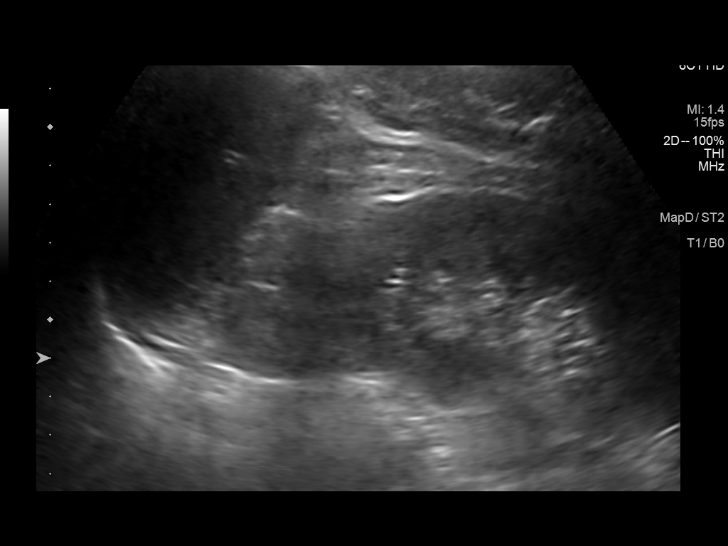
[im 18/40]
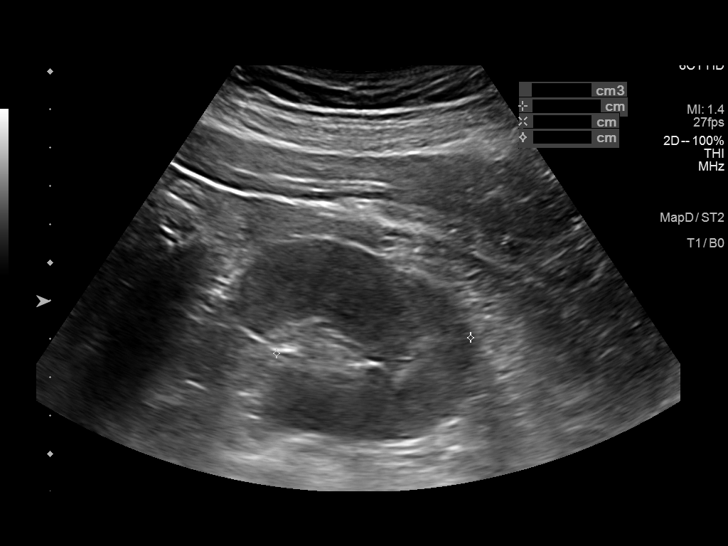
[im 22/40]
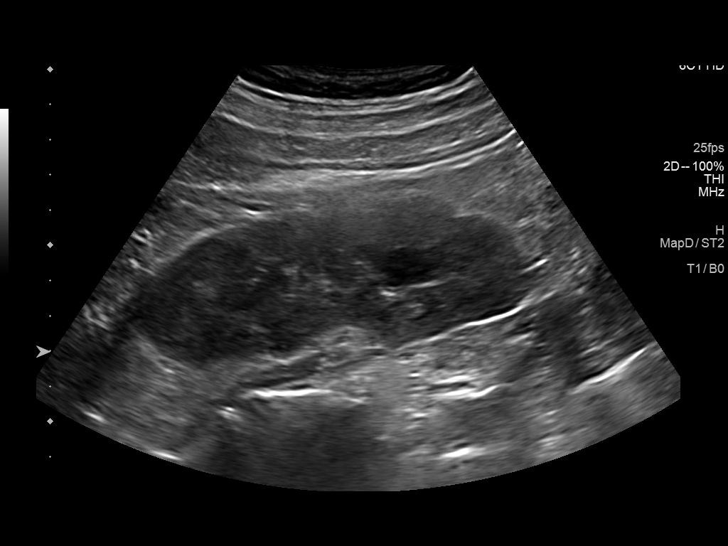
[im 25/40]
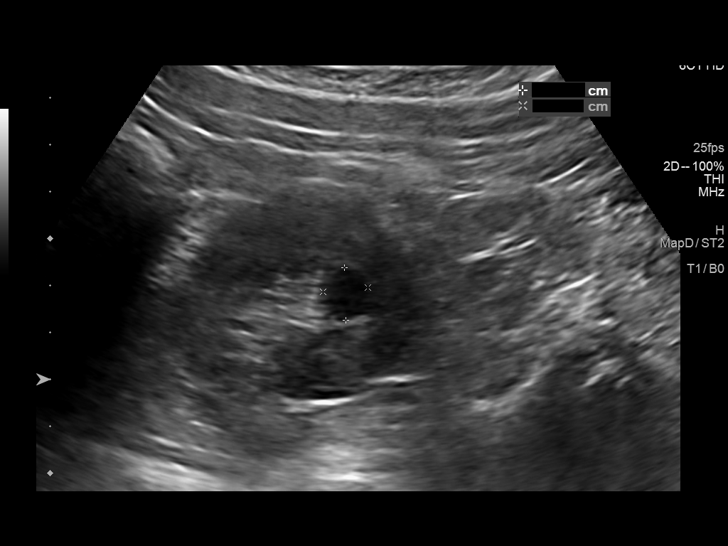
[im 27/40]
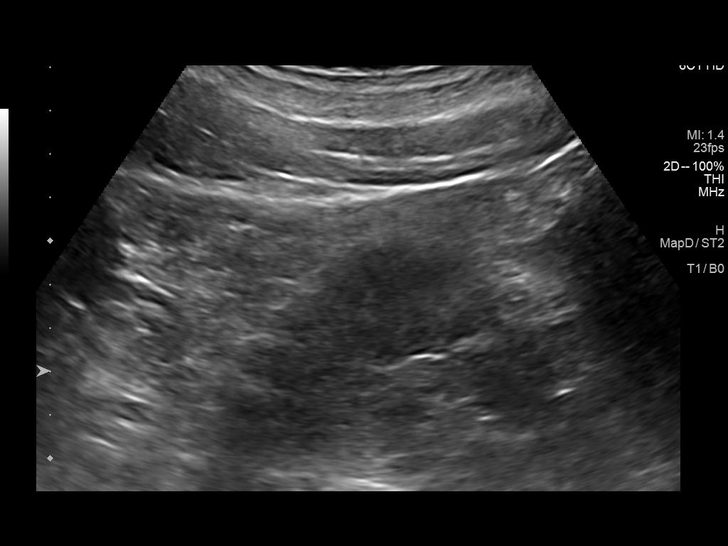
[im 30/40]
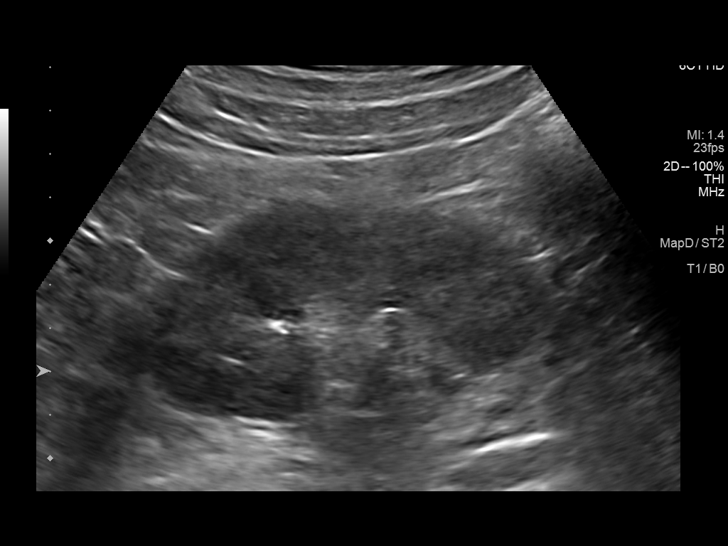
[im 33/40]
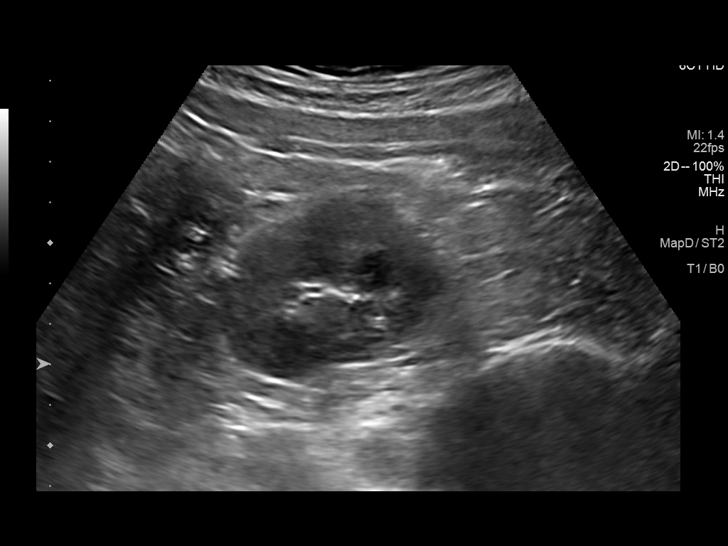
[im 36/40]
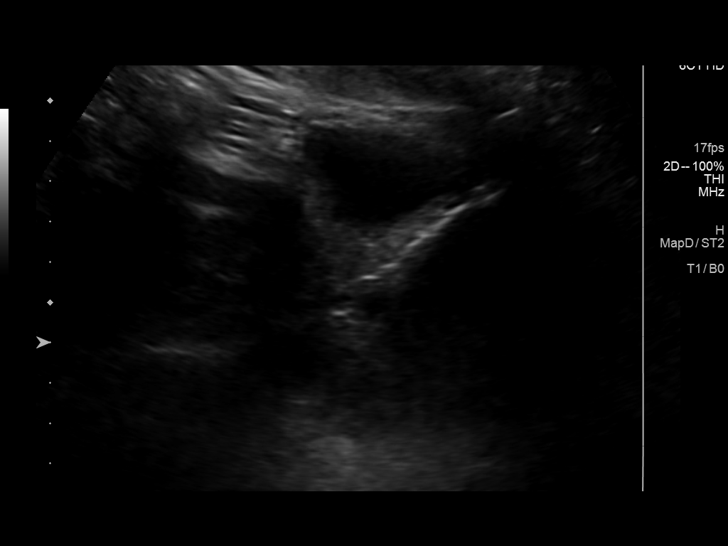
[im 40/40]
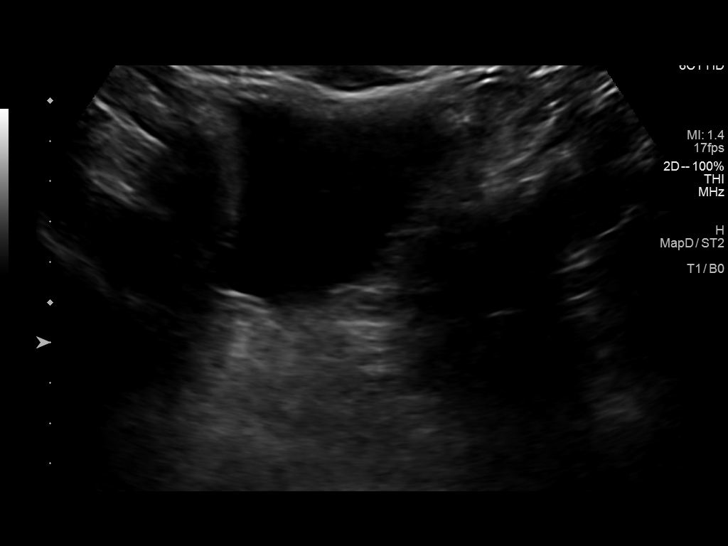

[14 of 25 positions shown; findings below may reference images not displayed]

FINDINGS: Right Kidney:

Renal measurements: 11.1 x 5.4 x 5.5 cm = volume: 174 mL .
Echogenicity within normal limits. No mass or hydronephrosis
visualized.

Left Kidney:

Renal measurements: 11.3 x 4.6 x 5.1 cm = volume: 137 mL.
Echogenicity within normal limits. Small cyst at the upper bulb
measuring up to 1.5 cm. Additional small cyst at the lower pole
measuring up to 1.4 cm. No hydronephrosis visualized.

Bladder:

Appears normal for degree of bladder distention.

Other:

None.
IMPRESSION: Normal sonographic appearance of the kidneys.

## 2020-08-10 ENCOUNTER — Encounter: Payer: Medicare Other | Admitting: Internal Medicine

## 2020-09-18 ENCOUNTER — Other Ambulatory Visit: Payer: Self-pay

## 2020-10-05 ENCOUNTER — Other Ambulatory Visit: Payer: Self-pay

## 2020-10-05 DIAGNOSIS — I739 Peripheral vascular disease, unspecified: Secondary | ICD-10-CM

## 2020-10-16 ENCOUNTER — Ambulatory Visit (INDEPENDENT_AMBULATORY_CARE_PROVIDER_SITE_OTHER): Payer: Medicare Other | Admitting: Vascular Surgery

## 2020-10-16 ENCOUNTER — Ambulatory Visit (HOSPITAL_COMMUNITY)
Admission: RE | Admit: 2020-10-16 | Discharge: 2020-10-16 | Disposition: A | Discharge status: Home / Self Care | Payer: Medicare Other | Source: Ambulatory Visit | Attending: Vascular Surgery | Admitting: Vascular Surgery

## 2020-10-16 ENCOUNTER — Other Ambulatory Visit: Payer: Self-pay

## 2020-10-16 ENCOUNTER — Encounter: Payer: Self-pay | Admitting: Vascular Surgery

## 2020-10-16 VITALS — BP 157/98 | HR 84 | Temp 98.4°F | Resp 20 | Ht 66.0 in | Wt 187.0 lb

## 2020-10-16 DIAGNOSIS — I739 Peripheral vascular disease, unspecified: Secondary | ICD-10-CM

## 2020-10-16 NOTE — Progress Notes (Signed)
Patient ID: Donald Martinez, male   DOB: [birth year], 70 y.o.   MRN: [id number]  Reason for Consult: New Patient (Initial Visit)   Referred by Sonia Side., FNP  Subjective:     HPI:  Donald Martinez is a 70 y.o. male does not have a previous history of vascular disease.  He does have hyperlipidemia, hypertension and is a long-term smoking.  He has bilateral lower extremity pain worse with walking.  He states it starts at his hips and radiates down both sides are equal.  He does have swelling of the left leg not the right but again pain is the same.  No tissue loss or ulceration.  Currently smokes 5 cigarettes/day.  He has never had any vascular intervention bilateral lower extremities.  Denies history of stroke, TIA or amaurosis.  Does not have a previous personal or family history of aneurysm disease.  He does not take any blood thinners or antiplatelet agents.  Past Medical History:  Diagnosis Date  . Ankle sprain   . Asthma   . Bilateral claudication of lower limb (HCC)    Intermittent  . Bilateral leg pain   . COPD (chronic obstructive pulmonary disease) ([place])   . Gallstones 11/04/2018  . GERD (gastroesophageal reflux disease)   . Hyperlipidemia   . Hypertension   . Muscle pain   . Numbness and tingling   . Osteoarthritis   . PVD (peripheral vascular disease) ([place])   . Swelling    Occasionally  . Weight gain    Family History  Problem Relation Age of Onset  . Heart disease Mother   . Heart disease Father   . Breast cancer Sister   . Pancreatic cancer Brother   . Prostate cancer Brother   . Colon polyps Neg Hx   . Colon cancer Neg Hx   . Esophageal cancer Neg Hx   . Rectal cancer Neg Hx   . Stomach cancer Neg Hx    Past Surgical History:  Procedure Laterality Date  . CHOLECYSTECTOMY N/A 11/04/2018   Procedure: LAPAROSCOPIC CHOLECYSTECTOMY WITH INTRAOPERATIVE CHOLANGIOGRAM;  Surgeon: Fanny Skates, MD;  Location: [facility];  Service: General;   Laterality: N/A;  . COLONOSCOPY  08/04/2018  . POLYPECTOMY      Short Social History:  Social History   Tobacco Use  . Smoking status: Current Every Day Smoker    Packs/day: 0.25    Types: Cigarettes  . Smokeless tobacco: Never Used  Substance Use Topics  . Alcohol use: Yes    Comment: occ    No Known Allergies  Current Outpatient Medications  Medication Sig Dispense Refill  . albuterol (ACCUNEB) 0.63 MG/3ML nebulizer solution Inhale into the lungs.    Marland Kitchen amLODipine (NORVASC) 10 MG tablet Take by mouth.    Marland Kitchen amLODipine (NORVASC) 2.5 MG tablet Take 2.5 mg by mouth daily.    Marland Kitchen aspirin 500 MG tablet Take by mouth.    Marland Kitchen atorvastatin (LIPITOR) 20 MG tablet Take 20 mg by mouth daily.    . budesonide-formoterol (SYMBICORT) 160-4.5 MCG/ACT inhaler Inhale 2 puffs into the lungs 2 (two) times daily.    . diclofenac sodium (VOLTAREN) 1 % GEL Apply 2 g topically 4 (four) times daily as needed (pain.).     Marland Kitchen Fluticasone-Salmeterol (ADVAIR) 250-50 MCG/DOSE AEPB Inhale 1 puff into the lungs daily.     Marland Kitchen ibuprofen (ADVIL,MOTRIN) 200 MG tablet Take 400 mg by mouth every 8 (eight) hours as needed for moderate pain.     Marland Kitchen  meloxicam (MOBIC) 7.5 MG tablet TK 1 T PO QD FOR OSTEOARTHRITIS    . montelukast (SINGULAIR) 10 MG tablet Take 10 mg by mouth at bedtime.    . Na Sulfate-K Sulfate-Mg Sulf 17.5-3.13-1.6 GM/177ML SOLN Suprep (no substitutions)-TAKE AS DIRECTED. 354 mL 0  . nicotine polacrilex (NICORETTE) 4 MG gum Take by mouth.    Marland Kitchen omeprazole (PRILOSEC) 10 MG capsule Take by mouth.    Marland Kitchen omeprazole (PRILOSEC) 20 MG capsule Take 20 mg by mouth daily.    . traZODone (DESYREL) 50 MG tablet Take 50 mg by mouth at bedtime.    Marland Kitchen atenolol-chlorthalidone (TENORETIC) 100-25 MG tablet  (Patient not taking: Reported on 10/16/2020)    . diclofenac (VOLTAREN) 25 MG EC tablet Take by mouth. (Patient not taking: Reported on 10/16/2020)    . hydrALAZINE (APRESOLINE) 100 MG tablet Take 100 mg by mouth 2 (two) times  daily.    Marland Kitchen lisinopril (PRINIVIL,ZESTRIL) 40 MG tablet Take 40 mg by mouth daily. (Patient not taking: Reported on 10/16/2020)    . lisinopril (ZESTRIL) 10 MG tablet Take by mouth. (Patient not taking: Reported on 10/16/2020)    . losartan (COZAAR) 100 MG tablet Take 1 tablet by mouth daily.    . meloxicam (MOBIC) 15 MG tablet Take by mouth. (Patient not taking: Reported on 10/16/2020)    . traMADol (ULTRAM) 50 MG tablet Take by mouth. (Patient not taking: Reported on 10/16/2020)     No current facility-administered medications for this visit.    Review of Systems  Constitutional:  Constitutional negative. HENT: HENT negative.  Eyes: Eyes negative.  Cardiovascular: Positive for claudication and leg swelling.  GI: Gastrointestinal negative.  Musculoskeletal: Positive for leg pain.  Neurological: Neurological negative. Hematologic: Hematologic/lymphatic negative.  Psychiatric: Psychiatric negative.        Objective:  Objective  Vitals:   10/16/20 1344  BP: (!) 157/98  Pulse: 84  Resp: 20  Temp: 98.4 F (36.9 C)  SpO2: 95%    Physical Exam HENT:     Head: Normocephalic.     Nose:     Comments: Wearing a mask Neck:     Vascular: No carotid bruit.  Cardiovascular:     Rate and Rhythm: Normal rate.     Pulses: Normal pulses.  Pulmonary:     Effort: Pulmonary effort is normal.  Abdominal:     General: Abdomen is flat.     Palpations: Abdomen is soft.  Musculoskeletal:        General: Normal range of motion.     Cervical back: Normal range of motion and neck supple.     Right lower leg: No edema.     Left lower leg: Edema present.  Skin:    General: Skin is warm.  Neurological:     General: No focal deficit present.     Mental Status: He is alert.  Psychiatric:        Mood and Affect: Mood normal.        Behavior: Behavior normal.        Thought Content: Thought content normal.        Judgment: Judgment normal.     Data: ABI Findings:   +---------+------------------+-----+---------+--------+  Right  Rt Pressure (mmHg)IndexWaveform Comment   +---------+------------------+-----+---------+--------+  Brachial 170                      +---------+------------------+-----+---------+--------+  ATA   174        1.02 triphasic      +---------+------------------+-----+---------+--------+  PTA   187        1.10 triphasic      +---------+------------------+-----+---------+--------+  Great Toe115        0.68 Normal        +---------+------------------+-----+---------+--------+   +---------+------------------+-----+---------+-------+  Left   Lt Pressure (mmHg)IndexWaveform Comment  +---------+------------------+-----+---------+-------+  Brachial 158                      +---------+------------------+-----+---------+-------+  ATA   165        0.97 triphasic      +---------+------------------+-----+---------+-------+  PTA   212        1.25 triphasic      +---------+------------------+-----+---------+-------+  Great Toe129        0.76 Normal        +---------+------------------+-----+---------+-------+          Summary:  Right: Resting right ankle-brachial index is within normal range. No  evidence of significant right lower extremity arterial disease. The right  toe-brachial index is normal.   Left: Resting left ankle-brachial index is within normal range. No  evidence of significant left lower extremity arterial disease. The left  toe-brachial index is normal.      Assessment/Plan:     70 year old male with what appears to be bilateral lower extremity claudication although his pulses are easily palpable.  Given his risk factors of vascular disease including ongoing smoking with hyperlipidemia and hypertension I have recommended baby aspirin daily.   Symptoms do does not appear to be classic claudication.  I discussed with him that possible he does have claudication despite palpable pulses and we will get him back in 4 to 6 weeks with lower extremity exercise studies.  If the studies are normal he will not have any indication for vascular intervention.  I did discuss with him the need for smoking cessation is going to try to be at least 1 cigarette which will be for today at his next follow-up appointment.     Waynetta Sandy MD [facility]

## 2020-10-18 ENCOUNTER — Other Ambulatory Visit: Payer: Self-pay

## 2020-10-18 DIAGNOSIS — I739 Peripheral vascular disease, unspecified: Secondary | ICD-10-CM

## 2020-11-24 ENCOUNTER — Ambulatory Visit (INDEPENDENT_AMBULATORY_CARE_PROVIDER_SITE_OTHER): Payer: Medicare Other | Admitting: Vascular Surgery

## 2020-11-24 ENCOUNTER — Ambulatory Visit (HOSPITAL_COMMUNITY)
Admission: RE | Admit: 2020-11-24 | Discharge: 2020-11-24 | Disposition: A | Discharge status: Home / Self Care | Payer: Medicare Other | Source: Ambulatory Visit | Attending: Vascular Surgery | Admitting: Vascular Surgery

## 2020-11-24 ENCOUNTER — Encounter: Payer: Self-pay | Admitting: Vascular Surgery

## 2020-11-24 ENCOUNTER — Other Ambulatory Visit: Payer: Self-pay

## 2020-11-24 VITALS — BP 176/97 | HR 67 | Temp 97.9°F | Resp 20 | Ht 66.0 in | Wt 187.0 lb

## 2020-11-24 DIAGNOSIS — I739 Peripheral vascular disease, unspecified: Secondary | ICD-10-CM | POA: Diagnosis not present

## 2020-11-24 NOTE — Progress Notes (Signed)
Patient ID: Donald Martinez, male   DOB: 05-Oct-1950, 70 y.o.   MRN: [id number]  Reason for Consult: Follow-up   Referred by Sonia Side., FNP  Subjective:     HPI:  Donald Martinez is a 70 y.o. male recently evaluated for bilateral lower extremity claudication.  He denies any tissue loss or ulceration.  States that the pain starts at his bilateral hips and radiates down.  It is worse on the left than the right.  He does continue to smoke now down to 5 cigarettes/week.  Denies any previous history of vascular intervention.  He is here today to follow-up with lower extremity walking studies  Past Medical History:  Diagnosis Date  . Ankle sprain   . Asthma   . Bilateral claudication of lower limb (HCC)    Intermittent  . Bilateral leg pain   . COPD (chronic obstructive pulmonary disease) ([place])   . Gallstones 11/04/2018  . GERD (gastroesophageal reflux disease)   . Hyperlipidemia   . Hypertension   . Muscle pain   . Numbness and tingling   . Osteoarthritis   . PVD (peripheral vascular disease) ([place])   . Swelling    Occasionally  . Weight gain    Family History  Problem Relation Age of Onset  . Heart disease Mother   . Heart disease Father   . Breast cancer Sister   . Pancreatic cancer Brother   . Prostate cancer Brother   . Colon polyps Neg Hx   . Colon cancer Neg Hx   . Esophageal cancer Neg Hx   . Rectal cancer Neg Hx   . Stomach cancer Neg Hx    Past Surgical History:  Procedure Laterality Date  . CHOLECYSTECTOMY N/A 11/04/2018   Procedure: LAPAROSCOPIC CHOLECYSTECTOMY WITH INTRAOPERATIVE CHOLANGIOGRAM;  Surgeon: Fanny Skates, MD;  Location: [facility];  Service: General;  Laterality: N/A;  . COLONOSCOPY  08/04/2018  . POLYPECTOMY      Short Social History:  Social History   Tobacco Use  . Smoking status: Current Every Day Smoker    Packs/day: 0.25    Types: Cigarettes  . Smokeless tobacco: Never Used  Substance Use Topics  . Alcohol  use: Yes    Comment: occ    No Known Allergies  Current Outpatient Medications  Medication Sig Dispense Refill  . albuterol (ACCUNEB) 0.63 MG/3ML nebulizer solution Inhale into the lungs.    Marland Kitchen amLODipine (NORVASC) 10 MG tablet Take by mouth.    Marland Kitchen amLODipine (NORVASC) 2.5 MG tablet Take 2.5 mg by mouth daily.    Marland Kitchen aspirin 500 MG tablet Take by mouth.    Marland Kitchen atorvastatin (LIPITOR) 20 MG tablet Take 20 mg by mouth daily.    . budesonide-formoterol (SYMBICORT) 160-4.5 MCG/ACT inhaler Inhale 2 puffs into the lungs 2 (two) times daily.    . diclofenac sodium (VOLTAREN) 1 % GEL Apply 2 g topically 4 (four) times daily as needed (pain.).     Marland Kitchen Fluticasone-Salmeterol (ADVAIR) 250-50 MCG/DOSE AEPB Inhale 1 puff into the lungs daily.     . hydrALAZINE (APRESOLINE) 100 MG tablet Take 100 mg by mouth 2 (two) times daily.    Marland Kitchen ibuprofen (ADVIL,MOTRIN) 200 MG tablet Take 400 mg by mouth every 8 (eight) hours as needed for moderate pain.     Marland Kitchen losartan (COZAAR) 100 MG tablet Take 1 tablet by mouth daily.    . meloxicam (MOBIC) 7.5 MG tablet TK 1 T PO QD FOR OSTEOARTHRITIS    .  montelukast (SINGULAIR) 10 MG tablet Take 10 mg by mouth at bedtime.    . Na Sulfate-K Sulfate-Mg Sulf 17.5-3.13-1.6 GM/177ML SOLN Suprep (no substitutions)-TAKE AS DIRECTED. 354 mL 0  . nicotine polacrilex (NICORETTE) 4 MG gum Take by mouth.    Marland Kitchen omeprazole (PRILOSEC) 10 MG capsule Take by mouth.    Marland Kitchen omeprazole (PRILOSEC) 20 MG capsule Take 20 mg by mouth daily.    . traMADol (ULTRAM) 50 MG tablet Take by mouth.    . traZODone (DESYREL) 50 MG tablet Take 50 mg by mouth at bedtime.    Marland Kitchen atenolol-chlorthalidone (TENORETIC) 100-25 MG tablet  (Patient not taking: No sig reported)    . diclofenac (VOLTAREN) 25 MG EC tablet Take by mouth. (Patient not taking: No sig reported)    . lisinopril (PRINIVIL,ZESTRIL) 40 MG tablet Take 40 mg by mouth daily. (Patient not taking: No sig reported)    . lisinopril (ZESTRIL) 10 MG tablet Take by  mouth. (Patient not taking: No sig reported)     No current facility-administered medications for this visit.    Review of Systems  Constitutional:  Constitutional negative. HENT: HENT negative.  Eyes: Eyes negative.  Cardiovascular: Positive for claudication.  GI: Gastrointestinal negative.  Musculoskeletal: Positive for leg pain.  Skin: Skin negative.  Neurological: Neurological negative. Hematologic: Hematologic/lymphatic negative.  Psychiatric: Psychiatric negative.        Objective:  Objective  Vitals:   11/24/20 1054  BP: (!) 176/97  Pulse: 67  Resp: 20  Temp: 97.9 F (36.6 C)  SpO2: 97%    Physical Exam HENT:     Head: Normocephalic.     Nose:     Comments: Wearing a mask Eyes:     Pupils: Pupils are equal, round, and reactive to light.  Cardiovascular:     Pulses: Normal pulses.  Pulmonary:     Effort: Pulmonary effort is normal.  Abdominal:     General: Abdomen is flat.     Palpations: Abdomen is soft.  Musculoskeletal:        General: Normal range of motion.     Right lower leg: No edema.     Left lower leg: Edema present.  Skin:    General: Skin is warm.     Capillary Refill: Capillary refill takes less than 2 seconds.  Neurological:     General: No focal deficit present.     Mental Status: He is alert.  Psychiatric:        Mood and Affect: Mood normal.        Behavior: Behavior normal.        Thought Content: Thought content normal.        Judgment: Judgment normal.     Data: ABI Findings:  +---------+------------------+-----+----------+--------+  Right  Rt Pressure (mmHg)IndexWaveform Comment   +---------+------------------+-----+----------+--------+  Brachial 202                      +---------+------------------+-----+----------+--------+  ATA   195        0.97            +---------+------------------+-----+----------+--------+  PTA   181        0.90 biphasic        +---------+------------------+-----+----------+--------+  DP                monophasic      +---------+------------------+-----+----------+--------+  Great Toe95        0.47            +---------+------------------+-----+----------+--------+   +---------+------------------+-----+----------+-------+  Left   Lt Pressure (mmHg)IndexWaveform Comment  +---------+------------------+-----+----------+-------+  Brachial 194                      +---------+------------------+-----+----------+-------+  ATA   198        0.98            +---------+------------------+-----+----------+-------+  PTA   201        1.00 biphasic       +---------+------------------+-----+----------+-------+  DP                monophasic      +---------+------------------+-----+----------+-------+  Great Toe110        0.54            +---------+------------------+-----+----------+-------+   +-------+-----------+-----------+------------+------------+  ABI/TBIToday's ABIToday's TBIPrevious ABIPrevious TBI  +-------+-----------+-----------+------------+------------+  Right 0.97    0.47    1.10    0.68      +-------+-----------+-----------+------------+------------+  Left  1.00    0.54    1.00    0.54      +-------+-----------+-----------+------------+------------+     Exercise:   The patient walked in the hallway for 5 minutes.   +----+--------+-------+----------------------------------------------------  ----+  TimeLocationSymptomComments                           +----+--------+-------+----------------------------------------------------  ----+  2.5 Thigh  Cramp Patient devloped transient right lateral thigh pain               after  2:30 walking.                     +----+--------+-------+----------------------------------------------------  ----+   After exercise the right ABI decreased, which is an abnormal exercise  response. After exercise the left ABI increased, which is a normal  exercise response.       Right ABIs appear essentially unchanged compared to prior study on  10/16/2020.    Summary:  Right: Resting right ankle-brachial index is within normal range. No  evidence of significant right lower extremity arterial disease. The right  toe-brachial index is abnormal. RT great toe pressure = 95 mmHg.  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  Right ABI dropped immediately post exercise and returned to baseline  within a minute.  Left: Resting left ankle-brachial index is within normal range. No  evidence of significant left lower extremity arterial disease. The left  toe-brachial index is abnormal. LT Great toe pressure = 110 mmHg.  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            Assessment/Plan:     70 year old male here with bilateral lower extremity pain with palpable pulses.  ABI does show decreased on the right.  Symptoms are frank claudication.  I recommended smoking cessation completely he is down to 5 cigarettes/week at this time.  He will continue aspirin daily.  He will follow-up in 1 year with repeat exercise studies as long as he has no issues prior.     Waynetta Sandy MD [facility]

## 2021-02-24 ENCOUNTER — Emergency Department (HOSPITAL_COMMUNITY): Payer: Medicare Other

## 2021-02-24 ENCOUNTER — Other Ambulatory Visit: Payer: Self-pay

## 2021-02-24 ENCOUNTER — Emergency Department (HOSPITAL_COMMUNITY)
Admission: EM | Admit: 2021-02-24 | Discharge: 2021-02-24 | Disposition: A | Discharge status: Home / Self Care | Payer: Medicare Other | Attending: Emergency Medicine | Admitting: Emergency Medicine

## 2021-02-24 DIAGNOSIS — M79644 Pain in right finger(s): Secondary | ICD-10-CM | POA: Diagnosis not present

## 2021-02-24 DIAGNOSIS — Z7982 Long term (current) use of aspirin: Secondary | ICD-10-CM | POA: Insufficient documentation

## 2021-02-24 DIAGNOSIS — J449 Chronic obstructive pulmonary disease, unspecified: Secondary | ICD-10-CM | POA: Diagnosis not present

## 2021-02-24 DIAGNOSIS — F17210 Nicotine dependence, cigarettes, uncomplicated: Secondary | ICD-10-CM | POA: Diagnosis not present

## 2021-02-24 DIAGNOSIS — I10 Essential (primary) hypertension: Secondary | ICD-10-CM | POA: Diagnosis not present

## 2021-02-24 LAB — CBC WITH DIFFERENTIAL/PLATELET
Abs Immature Granulocytes: 0.05 10*3/uL (ref 0.00–0.07)
Basophils Absolute: 0.1 10*3/uL (ref 0.0–0.1)
Basophils Relative: 1 %
Eosinophils Absolute: 0.2 10*3/uL (ref 0.0–0.5)
Eosinophils Relative: 2 %
HCT: 45.1 % (ref 39.0–52.0)
Hemoglobin: 15.6 g/dL (ref 13.0–17.0)
Immature Granulocytes: 1 %
Lymphocytes Relative: 28 %
Lymphs Abs: 1.9 10*3/uL (ref 0.7–4.0)
MCH: 31.3 pg (ref 26.0–34.0)
MCHC: 34.6 g/dL (ref 30.0–36.0)
MCV: 90.4 fL (ref 80.0–100.0)
Monocytes Absolute: 0.6 10*3/uL (ref 0.1–1.0)
Monocytes Relative: 8 %
Neutro Abs: 4 10*3/uL (ref 1.7–7.7)
Neutrophils Relative %: 60 %
Platelets: 254 10*3/uL (ref 150–400)
RBC: 4.99 MIL/uL (ref 4.22–5.81)
RDW: 12.6 % (ref 11.5–15.5)
WBC: 6.8 10*3/uL (ref 4.0–10.5)
nRBC: 0 % (ref 0.0–0.2)

## 2021-02-24 LAB — BASIC METABOLIC PANEL
Anion gap: 13 (ref 5–15)
BUN: 15 mg/dL (ref 8–23)
CO2: 24 mmol/L (ref 22–32)
Calcium: 8.9 mg/dL (ref 8.9–10.3)
Chloride: 102 mmol/L (ref 98–111)
Creatinine, Ser: 1.68 mg/dL — ABNORMAL HIGH (ref 0.61–1.24)
GFR, Estimated: 44 mL/min — ABNORMAL LOW (ref >60)
Glucose, Bld: 105 mg/dL — ABNORMAL HIGH (ref 70–99)
Potassium: 3.6 mmol/L (ref 3.5–5.1)
Sodium: 139 mmol/L (ref 135–145)

## 2021-02-24 MED ORDER — NAPROXEN 500 MG PO TABS: 500.0000 mg | ORAL_TABLET | Freq: Two times a day (BID) | ORAL | 0 refills | Status: AC

## 2021-02-24 MED ORDER — DOXYCYCLINE HYCLATE 100 MG PO CAPS: 100.0000 mg | ORAL_CAPSULE | Freq: Two times a day (BID) | ORAL | 0 refills | Status: AC

## 2021-02-24 MED ORDER — DOXYCYCLINE HYCLATE 100 MG PO TABS
100.0000 mg | ORAL_TABLET | Freq: Once | ORAL | Status: AC
  Administered 2021-02-24: 100 mg via ORAL
  Filled 2021-02-24: qty 1

## 2021-02-24 MED ORDER — SODIUM CHLORIDE 0.9 % IV BOLUS
1000.0000 mL | Freq: Once | INTRAVENOUS | Status: AC
  Administered 2021-02-24: 1000 mL via INTRAVENOUS

## 2021-02-24 MED ORDER — ACETAMINOPHEN 500 MG PO TABS
1000.0000 mg | ORAL_TABLET | Freq: Once | ORAL | Status: AC
  Administered 2021-02-24: 1000 mg via ORAL
  Filled 2021-02-24: qty 2

## 2021-02-24 NOTE — Discharge Instructions (Addendum)
Stop taking the clonidine.  Follow-up with your family doctor for your finger and your blood pressure

## 2021-02-24 NOTE — ED Triage Notes (Signed)
Complains of R index nail bed swelling and pain since August 8th.

## 2021-02-24 NOTE — ED Provider Notes (Signed)
[facility] DEPT Provider Note   CSN: [id number] Arrival date & time: 02/24/21  1647     History No chief complaint on file.   Donald Martinez is a 71 y.o. male with a history of asthma, COPD, hypertension, hyperlipidemia, osteoarthritis, PVD.  Presents to the emergency department with a chief complaint of right index finger swelling and pain.  Symptoms have been present since 02/12/2021.  Patient denies any known injury to affected finger.  Patient has had gradually worsening pain and swelling to affected digit.  Patient reports that pain radiates into his right arm.  Patient rates pain 9/10 on the pain scale.  Pain is worse with movement or touch.  Patient has had minimal relief with over-the-counter pain medication.  Denies any numbness, weakness, purulent discharge, fevers, chills.  Patient is right-hand dominant.  Patient unsure when his last tetanus shot was.  Patient also reports that he has had lightheadedness for 1 week.  Patient states that lightheadedness is present when he stands and ambulates.  Patient denies any chest pain, syncopal episodes.  Patient endorses shortness of breath with exertion at baseline, denies any change in this.     HPI     Past Medical History:  Diagnosis Date   Ankle sprain    Asthma    Bilateral claudication of lower limb (HCC)    Intermittent   Bilateral leg pain    COPD (chronic obstructive pulmonary disease) (HCC)    Gallstones 11/04/2018   GERD (gastroesophageal reflux disease)    Hyperlipidemia    Hypertension    Muscle pain    Numbness and tingling    Osteoarthritis    PVD (peripheral vascular disease) (HCC)    Swelling    Occasionally   Weight gain     Patient Active Problem List   Diagnosis Date Noted   Gallstones 11/04/2018    Past Surgical History:  Procedure Laterality Date   CHOLECYSTECTOMY N/A 11/04/2018   Procedure: LAPAROSCOPIC CHOLECYSTECTOMY WITH INTRAOPERATIVE CHOLANGIOGRAM;  Surgeon:  Fanny Skates, MD;  Location: [facility];  Service: General;  Laterality: N/A;   COLONOSCOPY  08/04/2018   POLYPECTOMY         Family History  Problem Relation Age of Onset   Heart disease Mother    Heart disease Father    Breast cancer Sister    Pancreatic cancer Brother    Prostate cancer Brother    Colon polyps Neg Hx    Colon cancer Neg Hx    Esophageal cancer Neg Hx    Rectal cancer Neg Hx    Stomach cancer Neg Hx     Social History   Tobacco Use   Smoking status: Every Day    Packs/day: 0.25    Types: Cigarettes   Smokeless tobacco: Never  Vaping Use   Vaping Use: Never used  Substance Use Topics   Alcohol use: Yes    Comment: occ   Drug use: No    Home Medications Prior to Admission medications   Medication Sig Start Date End Date Taking? Authorizing Provider  albuterol (ACCUNEB) 0.63 MG/3ML nebulizer solution Inhale into the lungs.    [provider]  amLODipine (NORVASC) 10 MG tablet Take by mouth.    [provider]  amLODipine (NORVASC) 2.5 MG tablet Take 2.5 mg by mouth daily.    [provider]  aspirin 500 MG tablet Take by mouth.    [provider]  atenolol-chlorthalidone (TENORETIC) 100-25 MG tablet  10/15/18  [provider]  atorvastatin (LIPITOR) 20 MG tablet Take 20 mg by mouth daily. 08/12/18   [provider]  budesonide-formoterol (SYMBICORT) 160-4.5 MCG/ACT inhaler Inhale 2 puffs into the lungs 2 (two) times daily.    [provider]  diclofenac (VOLTAREN) 25 MG EC tablet Take by mouth. Patient not taking: No sig reported    [provider]  diclofenac sodium (VOLTAREN) 1 % GEL Apply 2 g topically 4 (four) times daily as needed (pain.).     [provider]  Fluticasone-Salmeterol (ADVAIR) 250-50 MCG/DOSE AEPB Inhale 1 puff into the lungs daily.     [provider]  hydrALAZINE (APRESOLINE) 100 MG tablet Take 100 mg by mouth 2 (two) times  daily. 09/13/20   [provider]  ibuprofen (ADVIL,MOTRIN) 200 MG tablet Take 400 mg by mouth every 8 (eight) hours as needed for moderate pain.     [provider]  lisinopril (PRINIVIL,ZESTRIL) 40 MG tablet Take 40 mg by mouth daily. Patient not taking: No sig reported 08/12/18   [provider]  lisinopril (ZESTRIL) 10 MG tablet Take by mouth. Patient not taking: No sig reported    [provider]  losartan (COZAAR) 100 MG tablet Take 1 tablet by mouth daily. 09/13/20   [provider]  meloxicam (MOBIC) 7.5 MG tablet TK 1 T PO QD FOR OSTEOARTHRITIS 12/15/18   [provider]  montelukast (SINGULAIR) 10 MG tablet Take 10 mg by mouth at bedtime.    [provider]  Na Sulfate-K Sulfate-Mg Sulf 17.5-3.13-1.6 GM/177ML SOLN Suprep (no substitutions)-TAKE AS DIRECTED. 02/03/19   Irene Shipper, MD  nicotine polacrilex (NICORETTE) 4 MG gum Take by mouth.    [provider]  omeprazole (PRILOSEC) 10 MG capsule Take by mouth.    [provider]  omeprazole (PRILOSEC) 20 MG capsule Take 20 mg by mouth daily.    [provider]  traMADol (ULTRAM) 50 MG tablet Take by mouth.    [provider]  traZODone (DESYREL) 50 MG tablet Take 50 mg by mouth at bedtime. 09/13/20   [provider]    Allergies    Patient has no known allergies.  Review of Systems   Review of Systems  Constitutional:  Negative for chills and fever.  Eyes:  Negative for visual disturbance.  Respiratory:  Positive for shortness of breath.   Cardiovascular:  Negative for chest pain, palpitations and leg swelling.  Gastrointestinal:  Negative for abdominal pain, nausea and vomiting.  Genitourinary:  Negative for difficulty urinating and dysuria.  Musculoskeletal:  Positive for arthralgias and joint swelling. Negative for back pain and neck pain.  Skin:  Positive for color change. Negative for pallor, rash and wound.  Neurological:   Positive for light-headedness. Negative for dizziness, tremors, seizures, syncope, facial asymmetry, speech difficulty, weakness, numbness and headaches.  Psychiatric/Behavioral:  Negative for confusion.    Physical Exam Updated Vital Signs BP 130/75   Pulse 96   Temp 98.4 F (36.9 C) (Oral)   Resp 15   SpO2 100%   Physical Exam Vitals and nursing note reviewed.  Constitutional:      General: He is not in acute distress.    Appearance: He is not ill-appearing, toxic-appearing or diaphoretic.  HENT:     Head: Normocephalic.  Eyes:     General: No scleral icterus.       Right eye: No discharge.        Left eye: No discharge.  Cardiovascular:  Rate and Rhythm: Normal rate.  Pulmonary:     Effort: Pulmonary effort is normal. No tachypnea, bradypnea or respiratory distress.     Breath sounds: Normal breath sounds. No stridor.  Musculoskeletal:     Right upper arm: Normal.     Right elbow: Normal.     Right forearm: Normal.     Right wrist: Normal.     Right hand: Swelling, tenderness and bony tenderness present. No deformity or lacerations. Decreased range of motion. Normal sensation. Normal capillary refill.     Right lower leg: Normal.     Left lower leg: Normal.     Comments: Patient has swelling and erythema to DIP of right index finger.  Patient has decreased range of motion due to swelling; remainder of finger has full range of motion.  Cap refills less than 2 seconds in all digits of left hand, sensation is intact to all digits of left hand.  Skin:    General: Skin is warm and dry.  Neurological:     General: No focal deficit present.     Mental Status: He is alert.  Psychiatric:        Behavior: Behavior is cooperative.      ED Results / Procedures / Treatments   Labs (all labs ordered are listed, but only abnormal results are displayed) Labs Reviewed  BASIC METABOLIC PANEL - Abnormal; Notable for the following components:      Result Value   Glucose, Bld  105 (*)    Creatinine, Ser 1.68 (*)    GFR, Estimated 44 (*)    All other components within normal limits  CBC WITH DIFFERENTIAL/PLATELET    EKG None  Radiology DG Hand Complete Right  Result Date: 02/24/2021 CLINICAL DATA:  Right index finger pain and swelling. EXAM: RIGHT HAND - COMPLETE 3+ VIEW COMPARISON:  None. FINDINGS: Moderate soft tissue swelling is seen involving the distal index finger. No evidence of acute fracture or dislocation. Old fracture deformity is seen involving the distal 5th metacarpal. Mild-to-moderate osteoarthritis is seen involving the DIP joint of the index finger. Mild osteoarthritis is also seen involving the PIP joint of the little finger and interphalangeal joint of the thumb. IMPRESSION: Moderate soft tissue swelling involving distal index finger. No evidence of acute fracture. Osteoarthritis, as described above. Electronically Signed   By: Marlaine Hind M.D.   On: 02/24/2021 18:58    Procedures Procedures   Medications Ordered in ED Medications  sodium chloride 0.9 % bolus 1,000 mL (has no administration in time range)  acetaminophen (TYLENOL) tablet 1,000 mg (has no administration in time range)  doxycycline (VIBRA-TABS) tablet 100 mg (has no administration in time range)    ED Course  I have reviewed the triage vital signs and the nursing notes.  Pertinent labs & imaging results that were available during my care of the patient were reviewed by me and considered in my medical decision making (see chart for details).    MDM Rules/Calculators/A&P                           Alert 70 year old male in no acute distress, nontoxic appearing.  Presents with a chief complaint of swelling and to right index finger.  Reports that swelling started on 8/8 and has gotten progressively worse over time.  No known injury.  Patient has swelling and erythema to DIP of right index finger as noted in image above.  Concern for cellulitis versus  osteoarthritis.  Will  obtain basic lab work and x-ray imaging of right hand.  Patient was noted to be hypotensive on his arrival to the emergency department.  Blood pressure has been improved while sitting in hospital bed.  Will obtain orthostatic vital signs.  No orthostatic hypotension noted.    CBC is unremarkable BMP shows creatinine elevated at 1.68; will give patient 1 L fluid bolus.  X-ray imaging shows moderate soft tissue swelling involving distal index finger.  No evidence of acute fracture.  Mild to moderate osteoarthritis is seen involving the DIP joint of index finger.  Will start patient on doxycycline to cover for cellulitis infection.  Patient to follow-up with primary care provider for wound check next week and continued hypertension management.  Due to patient's hypotension consider changing patient's blood pressure ration regiment.  Patient reports many changes in how he is taking his blood pressure medication.  States that he only takes 50 mg of clonidine daily.  Patient also reports that he is prescribed Atenolol / Chlorthalidone, hydralazine, Cozaar, and Norvasc.  Patient will occasionally take Atenolol / Chlorthalidone and Cozaar but never takes all of his blood pressure medications at the same time.  States that the blood pressure medication "makes me woozy."  Patient care transferred to [facility] at the end of my shift. Patient presentation, ED course, and plan of care discussed with review of all pertinent labs and imaging. Please see his/her note for further details regarding further ED course and disposition.   Patient was discussed with and evaluated by Dr. Roderic Palau.   Final Clinical Impression(s) / ED Diagnoses Final diagnoses:  None    Rx / DC Orders ED Discharge Orders     None        Dyann Ruddle 02/24/21 2059    Milton Ferguson, MD 02/26/21 [phone]

## 2021-03-14 ENCOUNTER — Other Ambulatory Visit: Payer: Self-pay

## 2021-03-14 ENCOUNTER — Emergency Department (HOSPITAL_COMMUNITY)
Admission: EM | Admit: 2021-03-14 | Discharge: 2021-03-14 | Discharge status: Against Medical Advice | Payer: Medicare Other | Attending: Emergency Medicine | Admitting: Emergency Medicine

## 2021-03-14 ENCOUNTER — Encounter (HOSPITAL_COMMUNITY): Payer: Self-pay

## 2021-03-14 DIAGNOSIS — R220 Localized swelling, mass and lump, head: Secondary | ICD-10-CM | POA: Insufficient documentation

## 2021-03-14 DIAGNOSIS — Z5321 Procedure and treatment not carried out due to patient leaving prior to being seen by health care provider: Secondary | ICD-10-CM | POA: Diagnosis not present

## 2021-03-14 NOTE — ED Provider Notes (Signed)
Eloped prior to my evaluation   Erek Kowal, Grayce Sessions, MD 03/14/21 0400

## 2021-03-14 NOTE — ED Triage Notes (Signed)
Pt complains of a lump to the back of the right side of his head x 1 week.

## 2021-03-14 NOTE — ED Notes (Signed)
MD went to room to see pt but room was empty. Pt not noted by staff to have left. Bathrooms checked.

## 2021-03-14 NOTE — ED Notes (Signed)
Pt states that he has a knot to the right posterior portion of his head that showed up several days ago. Pt states that he has not hit his head, and has had no instances of LOC.

## 2021-11-13 ENCOUNTER — Other Ambulatory Visit: Payer: Self-pay

## 2021-11-13 DIAGNOSIS — I739 Peripheral vascular disease, unspecified: Secondary | ICD-10-CM

## 2021-11-28 ENCOUNTER — Ambulatory Visit (INDEPENDENT_AMBULATORY_CARE_PROVIDER_SITE_OTHER): Payer: Medicare Other | Admitting: Physician Assistant

## 2021-11-28 ENCOUNTER — Ambulatory Visit (HOSPITAL_COMMUNITY)
Admission: RE | Admit: 2021-11-28 | Discharge: 2021-11-28 | Disposition: A | Discharge status: Home / Self Care | Payer: Medicare Other | Source: Ambulatory Visit | Attending: Vascular Surgery | Admitting: Vascular Surgery

## 2021-11-28 VITALS — BP 145/88 | HR 71 | Temp 98.1°F | Resp 20 | Ht 66.5 in | Wt 172.9 lb

## 2021-11-28 DIAGNOSIS — I739 Peripheral vascular disease, unspecified: Secondary | ICD-10-CM | POA: Insufficient documentation

## 2021-11-28 NOTE — Progress Notes (Signed)
Office Note     CC:  follow up Requesting Provider:  Sonia Side., FNP  HPI: Donald Martinez is a 71 y.o. (July 31, 1950) male who presents for repeat of exercising ABIs since last office visit 1 year ago.  He continues to have bilateral lateral thigh and calf claudication after 2 city blocks of walking.  He is ambulatory with a cane.  He also has lumbar spine stenosis.  He denies any rest pain or tissue loss of bilateral lower extremities.  He continues to smoke about 5 cigarettes a day.   Past Medical History:  Diagnosis Date   Ankle sprain    Asthma    Bilateral claudication of lower limb (HCC)    Intermittent   Bilateral leg pain    COPD (chronic obstructive pulmonary disease) ([facility])    Gallstones 11/04/2018   GERD (gastroesophageal reflux disease)    Hyperlipidemia    Hypertension    Muscle pain    Numbness and tingling    Osteoarthritis    PVD (peripheral vascular disease) (HCC)    Swelling    Occasionally   Weight gain     Past Surgical History:  Procedure Laterality Date   CHOLECYSTECTOMY N/A 11/04/2018   Procedure: LAPAROSCOPIC CHOLECYSTECTOMY WITH INTRAOPERATIVE CHOLANGIOGRAM;  Surgeon: Fanny Skates, MD;  Location: [facility];  Service: General;  Laterality: N/A;   COLONOSCOPY  08/04/2018   POLYPECTOMY      Social History   Socioeconomic History   Marital status: Legally Separated    Spouse name: Not on file   Number of children: 2   Years of education: Not on file   Highest education level: Not on file  Occupational History   Not on file  Tobacco Use   Smoking status: Every Day    Packs/day: 0.25    Types: Cigarettes    Passive exposure: Current   Smokeless tobacco: Never  Vaping Use   Vaping Use: Never used  Substance and Sexual Activity   Alcohol use: Yes    Comment: occ   Drug use: No   Sexual activity: Not on file  Other Topics Concern   Not on file  Social History Narrative   Not on file   Social Determinants of  Health   Financial Resource Strain: Not on file  Food Insecurity: Not on file  Transportation Needs: Not on file  Physical Activity: Not on file  Stress: Not on file  Social Connections: Not on file  Intimate Partner Violence: Not on file    Family History  Problem Relation Age of Onset   Heart disease Mother    Heart disease Father    Breast cancer Sister    Pancreatic cancer Brother    Prostate cancer Brother    Colon polyps Neg Hx    Colon cancer Neg Hx    Esophageal cancer Neg Hx    Rectal cancer Neg Hx    Stomach cancer Neg Hx     Current Outpatient Medications  Medication Sig Dispense Refill   albuterol (ACCUNEB) 0.63 MG/3ML nebulizer solution Inhale into the lungs.     amLODipine (NORVASC) 10 MG tablet Take by mouth.     amLODipine (NORVASC) 2.5 MG tablet Take 2.5 mg by mouth daily.     aspirin 500 MG tablet Take by mouth.     atenolol-chlorthalidone (TENORETIC) 100-25 MG tablet      atorvastatin (LIPITOR) 20 MG tablet Take 20 mg by mouth daily.     budesonide-formoterol (SYMBICORT)  160-4.5 MCG/ACT inhaler Inhale 2 puffs into the lungs 2 (two) times daily.     diclofenac (VOLTAREN) 25 MG EC tablet Take by mouth.     diclofenac sodium (VOLTAREN) 1 % GEL Apply 2 g topically 4 (four) times daily as needed (pain.).      doxycycline (VIBRAMYCIN) 100 MG capsule Take 1 capsule (100 mg total) by mouth 2 (two) times daily. One po bid x 7 days 14 capsule 0   Fluticasone-Salmeterol (ADVAIR) 250-50 MCG/DOSE AEPB Inhale 1 puff into the lungs daily.      gabapentin (NEURONTIN) 300 MG capsule Take by mouth.     hydrALAZINE (APRESOLINE) 100 MG tablet Take 100 mg by mouth 2 (two) times daily.     lisinopril (PRINIVIL,ZESTRIL) 40 MG tablet Take 40 mg by mouth daily.     lisinopril (ZESTRIL) 10 MG tablet Take by mouth.     losartan (COZAAR) 100 MG tablet Take 1 tablet by mouth daily.     meloxicam (MOBIC) 7.5 MG tablet TK 1 T PO QD FOR OSTEOARTHRITIS     montelukast (SINGULAIR) 10 MG  tablet Take 10 mg by mouth at bedtime.     Na Sulfate-K Sulfate-Mg Sulf 17.5-3.13-1.6 GM/177ML SOLN Suprep (no substitutions)-TAKE AS DIRECTED. 354 mL 0   naproxen (NAPROSYN) 500 MG tablet Take 1 tablet (500 mg total) by mouth 2 (two) times daily. 20 tablet 0   nicotine polacrilex (NICORETTE) 4 MG gum Take by mouth.     omeprazole (PRILOSEC) 10 MG capsule Take by mouth.     omeprazole (PRILOSEC) 20 MG capsule Take 20 mg by mouth daily.     traMADol (ULTRAM) 50 MG tablet Take by mouth.     traZODone (DESYREL) 50 MG tablet Take 50 mg by mouth at bedtime.     No current facility-administered medications for this visit.    No Known Allergies   REVIEW OF SYSTEMS:   '[X]'$  denotes positive finding, '[ ]'$  denotes negative finding Cardiac  Comments:  Chest pain or chest pressure:    Shortness of breath upon exertion:    Short of breath when lying flat:    Irregular heart rhythm:        Vascular    Pain in calf, thigh, or hip brought on by ambulation:    Pain in feet at night that wakes you up from your sleep:     Blood clot in your veins:    Leg swelling:         Pulmonary    Oxygen at home:    Productive cough:     Wheezing:         Neurologic    Sudden weakness in arms or legs:     Sudden numbness in arms or legs:     Sudden onset of difficulty speaking or slurred speech:    Temporary loss of vision in one eye:     Problems with dizziness:         Gastrointestinal    Blood in stool:     Vomited blood:         Genitourinary    Burning when urinating:     Blood in urine:        Psychiatric    Major depression:         Hematologic    Bleeding problems:    Problems with blood clotting too easily:        Skin    Rashes or ulcers:  Constitutional    Fever or chills:      PHYSICAL EXAMINATION:  Vitals:   11/28/21 0840  BP: (!) 145/88  Pulse: 71  Resp: 20  Temp: 98.1 F (36.7 C)  TempSrc: Temporal  SpO2: 98%  Weight: 172 lb 14.4 oz (78.4 kg)  Height: 5'  6.5" (1.689 m)    General:  WDWN in NAD; vital signs documented above Gait: Not observed HENT: WNL, normocephalic Pulmonary: normal non-labored breathing , without Rales, rhonchi,  wheezing Cardiac: regular HR Abdomen: soft, NT, no masses Skin: without rashes Vascular Exam/Pulses:  Right Left  Radial 2+ (normal) 2+ (normal)  DP 1+ (weak) 1+ (weak)  PT 2+ (normal) 2+ (normal)   Extremities: without ischemic changes, without Gangrene , without cellulitis; without open wounds;  Musculoskeletal: no muscle wasting or atrophy  Neurologic: A&O X 3;  No focal weakness or paresthesias are detected Psychiatric:  The pt has Normal affect.   Non-Invasive Vascular Imaging:   Exercising ABIs within normal limits    ASSESSMENT/PLAN:: 71 y.o. male here for follow up for repeat of exercising ABIs  -Exercising ABIs are within normal limits.  Patient also has normal toe pressures bilaterally.  He has easily palpable pedal pulses bilaterally.  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  Recommend follow-up with his PCP for further work-up.  No indication for repeating exercise ABIs at this time.  He will call with any problems or concerns.   Dagoberto Ligas, PA-C [facility] [phone]  Clinic MD:   Donzetta Matters

## 2022-04-22 IMAGING — DX DG HAND COMPLETE 3+V*R*
3 series · 3 of 3 positions shown · non-contrast
Comparison: None.

CLINICAL DATA: Right index finger pain and swelling.

EXAM:
RIGHT HAND - COMPLETE 3+ VIEW

[hand ap]
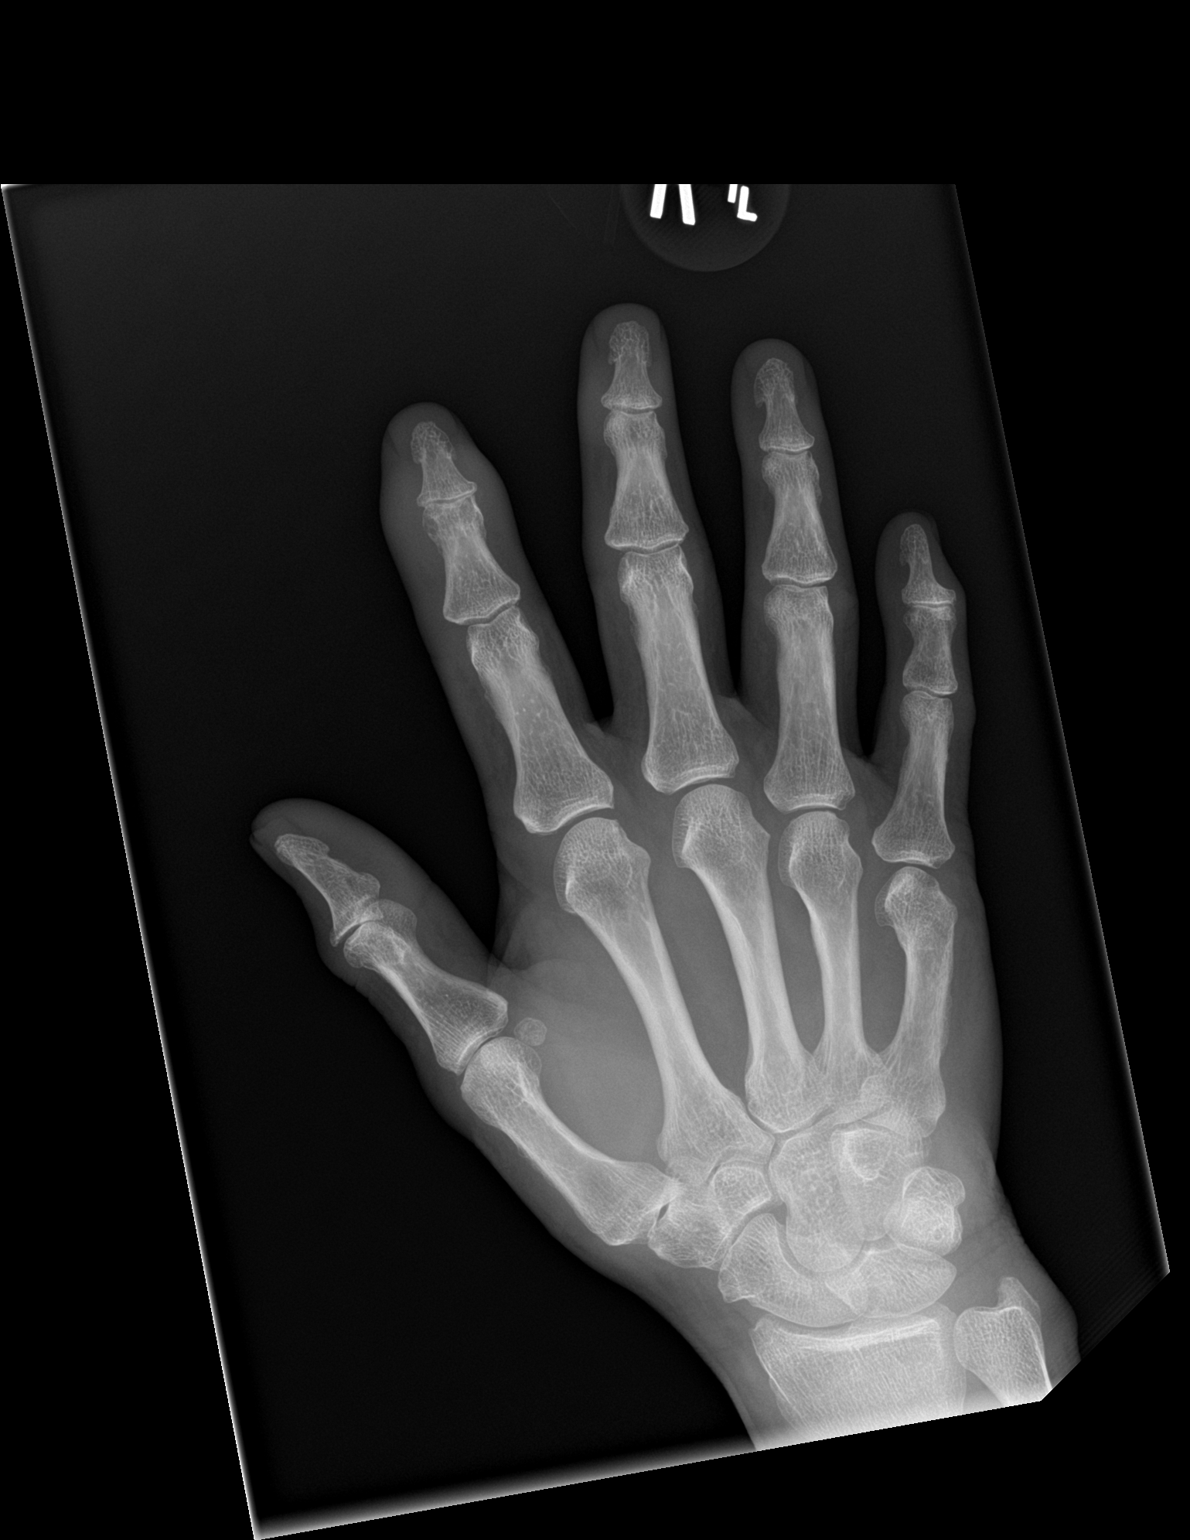

[hand obl]
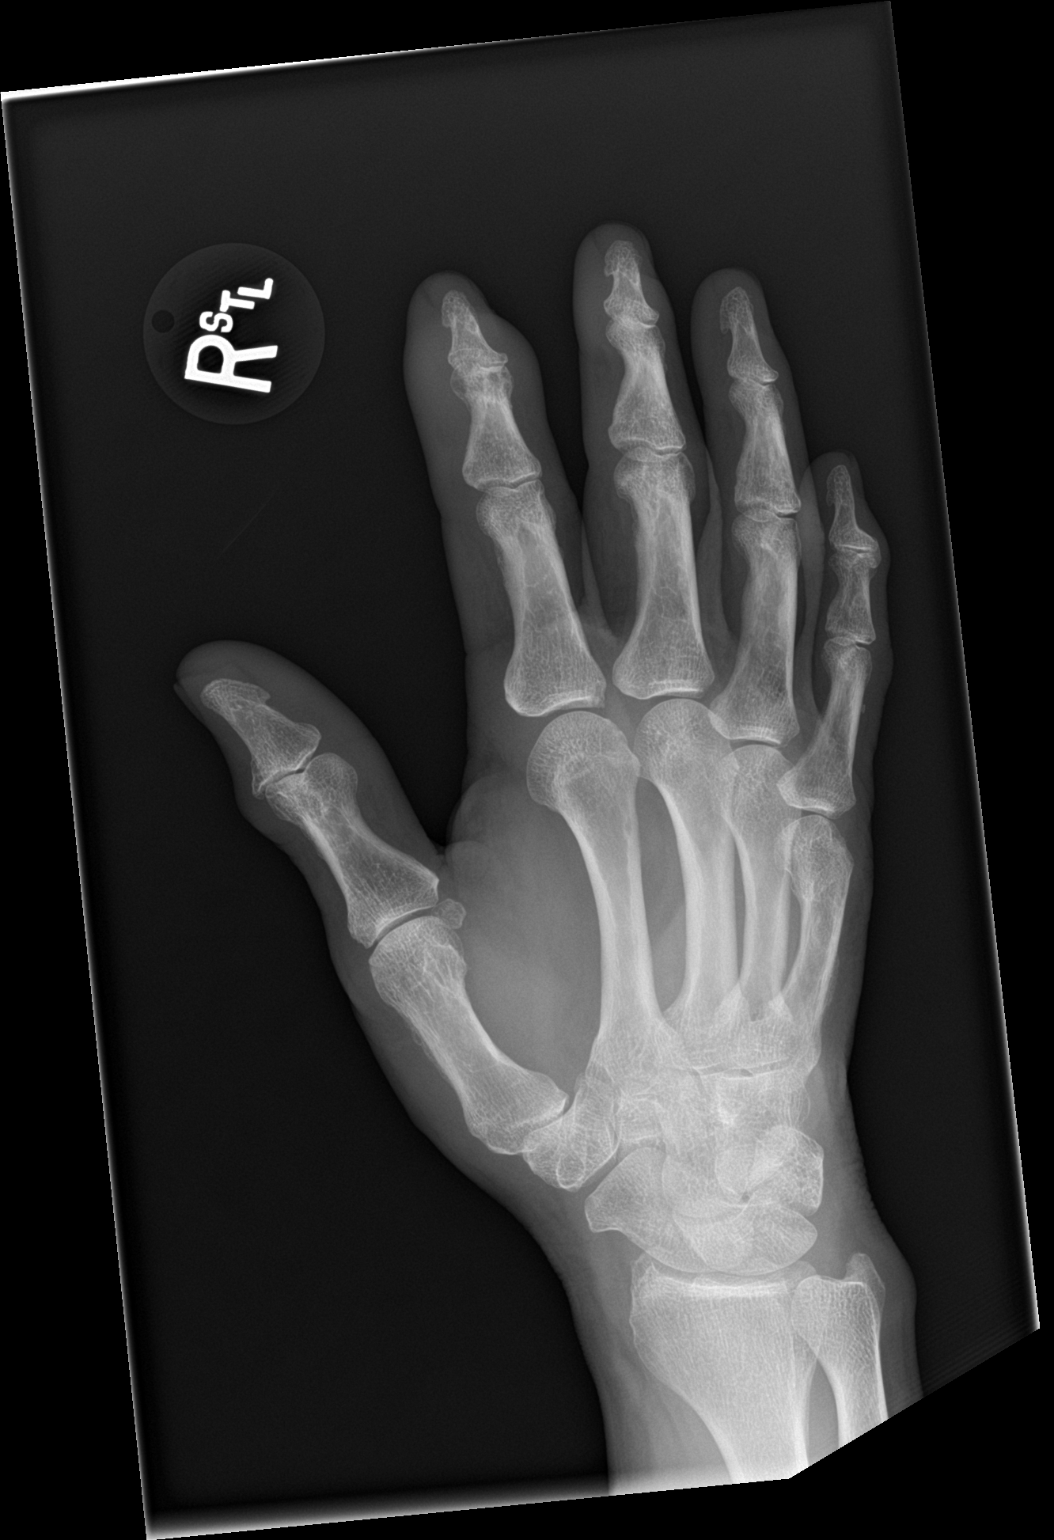

[hand lat]
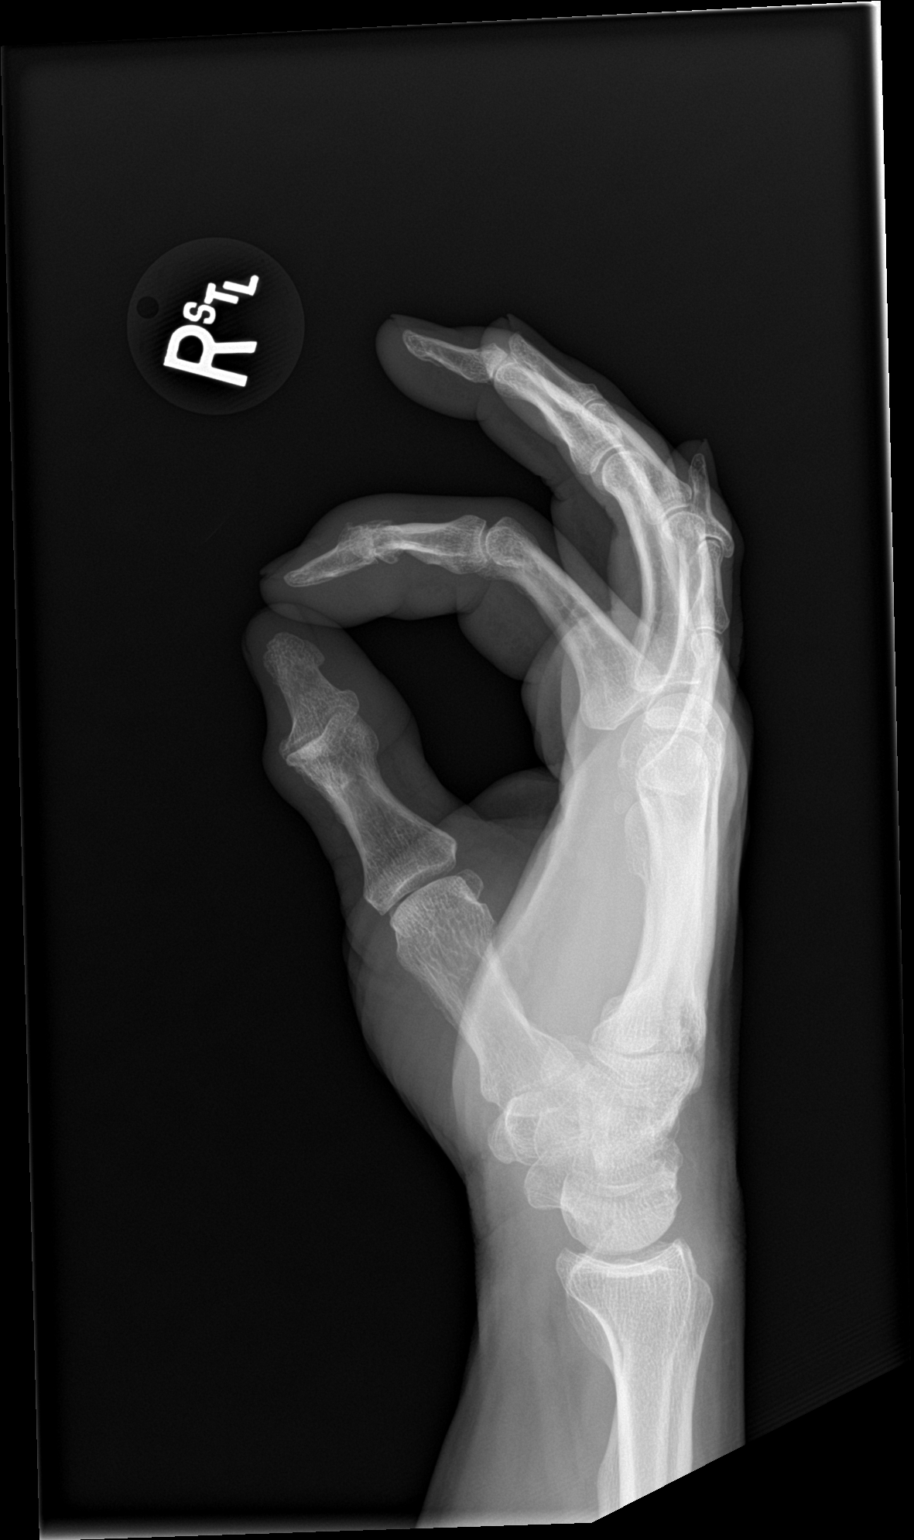

[3 of 3 positions shown; findings below may reference images not displayed]

FINDINGS: Moderate soft tissue swelling is seen involving the distal index
finger. No evidence of acute fracture or dislocation.

Old fracture deformity is seen involving the distal 5th metacarpal.
Mild-to-moderate osteoarthritis is seen involving the DIP joint of
the index finger. Mild osteoarthritis is also seen involving the PIP
joint of the little finger and interphalangeal joint of the thumb.
IMPRESSION: Moderate soft tissue swelling involving distal index finger. No
evidence of acute fracture.

Osteoarthritis, as described above.
